# Patient Record
Sex: Female | Born: 1985 | Race: Black or African American | Hispanic: No | Marital: Single | Smoking: Former smoker
Health system: Southern US, Community
[De-identification: ages and names within clinical notes are randomized; demographics above are authoritative.]

## PROBLEM LIST (undated history)

## (undated) HISTORY — PX: NO PAST SURGERIES: SHX2092

---

## 2016-03-22 DIAGNOSIS — I10 Essential (primary) hypertension: Secondary | ICD-10-CM | POA: Insufficient documentation

## 2016-03-22 HISTORY — DX: Essential (primary) hypertension: I10

## 2017-03-22 ENCOUNTER — Ambulatory Visit: Payer: Self-pay | Admitting: Urology

## 2017-03-22 VITALS — BP 143/95 | HR 121 | Temp 98.2°F | Ht 65.0 in | Wt 226.9 lb

## 2017-03-22 DIAGNOSIS — Z0000 Encounter for general adult medical examination without abnormal findings: Secondary | ICD-10-CM

## 2017-03-22 NOTE — Progress Notes (Signed)
  Patient: Jill [name] Female    DOB: 12/08/1985   31 y.o.   MRN: [id number] Visit Date: 03/22/2017  Today's Provider: [facility]   Chief Complaint  Patient presents with  . New Patient (Initial Visit)    Establishing Primary Care   Subjective:    HPI 31 yo AAF who presents today to establish a PCP.  She has no known medical problems.  She has a history of high BP during pregnancy and it is high tonight.    She has not had HA, CP, SOB or weakness.  No visual changes.      No Known Allergies Previous Medications   MEDROXYPROGESTERONE (DEPO-PROVERA) 150 MG/ML INJECTION    Inject 150 mg into the muscle every 3 (three) months.    Review of Systems  Constitutional: Negative.   HENT: Negative.   Eyes: Negative.   Respiratory: Negative.   Cardiovascular: Negative.   Gastrointestinal: Negative.   Endocrine: Negative.   Genitourinary: Negative.   Musculoskeletal: Negative.   Skin: Negative.   Allergic/Immunologic: Negative.   Neurological: Negative.   Hematological: Negative.   Psychiatric/Behavioral: Negative.     Social History  Substance Use Topics  . Smoking status: Current Some Day Smoker    Start date: 10/23/2010  . Smokeless tobacco: Not on file     Comment: 1 every 2 days  . Alcohol use No   Objective:   BP (!) 143/95 (BP Location: Left Arm, Patient Position: Sitting)   Pulse (!) 121   Temp 98.2 F (36.8 C)   Ht 5\' 5"  (1.651 m)   Wt 226 lb 14.4 oz (102.9 kg)   LMP 03/23/2014 (Approximate)   BMI 37.76 kg/m   Physical Exam Constitutional: Well nourished. Alert and oriented, No acute distress. HEENT: [place] AT, moist mucus membranes. Trachea midline, no masses. Cardiovascular: No clubbing, cyanosis, or edema. Respiratory: Normal respiratory effort, no increased work of breathing. GI: Abdomen is soft, non tender, non distended, no abdominal masses. Liver and spleen not palpable.  No hernias appreciated.  Stool sample for occult testing is not  indicated.   GU: No CVA tenderness.  No bladder fullness or masses.   Skin: No rashes, bruises or suspicious lesions. Lymph: No cervical or inguinal adenopathy. Neurologic: Grossly intact, no focal deficits, moving all 4 extremities. Psychiatric: Normal mood and affect.      Assessment & Plan:    1. Health visit   - will check baseline labs  2. HTN  - will most likely need to start BP meds  - reviewed DASH diet and encouraged exericise (moderate to vigorous exercise for 40 minutes x 4 week)         [facility]   [facility]

## 2017-03-22 NOTE — Patient Instructions (Addendum)
DASH Eating Plan DASH stands for "Dietary Approaches to Stop Hypertension." The DASH eating plan is a healthy eating plan that has been shown to reduce high blood pressure (hypertension). It may also reduce your risk for type 2 diabetes, heart disease, and stroke. The DASH eating plan may also help with weight loss. What are tips for following this plan? General guidelines  Avoid eating more than 2,300 mg (milligrams) of salt (sodium) a day. If you have hypertension, you may need to reduce your sodium intake to 1,500 mg a day.  Limit alcohol intake to no more than 1 drink a day for nonpregnant women and 2 drinks a day for men. One drink equals 12 oz of beer, 5 oz of wine, or 1 oz of hard liquor.  Work with your health care provider to maintain a healthy body weight or to lose weight. Ask what an ideal weight is for you.  Get at least 30 minutes of exercise that causes your heart to beat faster (aerobic exercise) most days of the week. Activities may include walking, swimming, or biking.  Work with your health care provider or diet and nutrition specialist (dietitian) to adjust your eating plan to your individual calorie needs. Reading food labels  Check food labels for the amount of sodium per serving. Choose foods with less than 5 percent of the Daily Value of sodium. Generally, foods with less than 300 mg of sodium per serving fit into this eating plan.  To find whole grains, look for the word "whole" as the first word in the ingredient list. Shopping  Buy products labeled as "low-sodium" or "no salt added."  Buy fresh foods. Avoid canned foods and premade or frozen meals. Cooking  Avoid adding salt when cooking. Use salt-free seasonings or herbs instead of table salt or sea salt. Check with your health care provider or pharmacist before using salt substitutes.  Do not fry foods. Cook foods using healthy methods such as baking, boiling, grilling, and broiling instead.  Cook with  heart-healthy oils, such as olive, canola, soybean, or sunflower oil. Meal planning   Eat a balanced diet that includes: ? 5 or more servings of fruits and vegetables each day. At each meal, try to fill half of your plate with fruits and vegetables. ? Up to 6-8 servings of whole grains each day. ? Less than 6 oz of lean meat, poultry, or fish each day. A 3-oz serving of meat is about the same size as a deck of cards. One egg equals 1 oz. ? 2 servings of low-fat dairy each day. ? A serving of nuts, seeds, or beans 5 times each week. ? Heart-healthy fats. Healthy fats called Omega-3 fatty acids are found in foods such as flaxseeds and coldwater fish, like sardines, salmon, and mackerel.  Limit how much you eat of the following: ? Canned or prepackaged foods. ? Food that is high in trans fat, such as fried foods. ? Food that is high in saturated fat, such as fatty meat. ? Sweets, desserts, sugary drinks, and other foods with added sugar. ? Full-fat dairy products.  Do not salt foods before eating.  Try to eat at least 2 vegetarian meals each week.  Eat more home-cooked food and less restaurant, buffet, and fast food.  When eating at a restaurant, ask that your food be prepared with less salt or no salt, if possible. What foods are recommended? The items listed may not be a complete list. Talk with your dietitian about what   dietary choices are best for you. Grains Whole-grain or whole-wheat bread. Whole-grain or whole-wheat pasta. Brown rice. Oatmeal. Quinoa. Bulgur. Whole-grain and low-sodium cereals. Pita bread. Low-fat, low-sodium crackers. Whole-wheat flour tortillas. Vegetables Fresh or frozen vegetables (raw, steamed, roasted, or grilled). Low-sodium or reduced-sodium tomato and vegetable juice. Low-sodium or reduced-sodium tomato sauce and tomato paste. Low-sodium or reduced-sodium canned vegetables. Fruits All fresh, dried, or frozen fruit. Canned fruit in natural juice (without  added sugar). Meat and other protein foods Skinless chicken or turkey. Ground chicken or turkey. Pork with fat trimmed off. Fish and seafood. Egg whites. Dried beans, peas, or lentils. Unsalted nuts, nut butters, and seeds. Unsalted canned beans. Lean cuts of beef with fat trimmed off. Low-sodium, lean deli meat. Dairy Low-fat (1%) or fat-free (skim) milk. Fat-free, low-fat, or reduced-fat cheeses. Nonfat, low-sodium ricotta or cottage cheese. Low-fat or nonfat yogurt. Low-fat, low-sodium cheese. Fats and oils Soft margarine without trans fats. Vegetable oil. Low-fat, reduced-fat, or light mayonnaise and salad dressings (reduced-sodium). Canola, safflower, olive, soybean, and sunflower oils. Avocado. Seasoning and other foods Herbs. Spices. Seasoning mixes without salt. Unsalted popcorn and pretzels. Fat-free sweets. What foods are not recommended? The items listed may not be a complete list. Talk with your dietitian about what dietary choices are best for you. Grains Baked goods made with fat, such as croissants, muffins, or some breads. Dry pasta or rice meal packs. Vegetables Creamed or fried vegetables. Vegetables in a cheese sauce. Regular canned vegetables (not low-sodium or reduced-sodium). Regular canned tomato sauce and paste (not low-sodium or reduced-sodium). Regular tomato and vegetable juice (not low-sodium or reduced-sodium). Pickles. Olives. Fruits Canned fruit in a light or heavy syrup. Fried fruit. Fruit in cream or butter sauce. Meat and other protein foods Fatty cuts of meat. Ribs. Fried meat. Bacon. Sausage. Bologna and other processed lunch meats. Salami. Fatback. Hotdogs. Bratwurst. Salted nuts and seeds. Canned beans with added salt. Canned or smoked fish. Whole eggs or egg yolks. Chicken or turkey with skin. Dairy Whole or 2% milk, cream, and half-and-half. Whole or full-fat cream cheese. Whole-fat or sweetened yogurt. Full-fat cheese. Nondairy creamers. Whipped toppings.  Processed cheese and cheese spreads. Fats and oils Butter. Stick margarine. Lard. Shortening. Ghee. Bacon fat. Tropical oils, such as coconut, palm kernel, or palm oil. Seasoning and other foods Salted popcorn and pretzels. Onion salt, garlic salt, seasoned salt, table salt, and sea salt. Worcestershire sauce. Tartar sauce. Barbecue sauce. Teriyaki sauce. Soy sauce, including reduced-sodium. Steak sauce. Canned and packaged gravies. Fish sauce. Oyster sauce. Cocktail sauce. Horseradish that you find on the shelf. Ketchup. Mustard. Meat flavorings and tenderizers. Bouillon cubes. Hot sauce and Tabasco sauce. Premade or packaged marinades. Premade or packaged taco seasonings. Relishes. Regular salad dressings. Where to find more information:  National Heart, Lung, and Blood Institute: www.nhlbi.nih.gov  American Heart Association: www.heart.org Summary  The DASH eating plan is a healthy eating plan that has been shown to reduce high blood pressure (hypertension). It may also reduce your risk for type 2 diabetes, heart disease, and stroke.  With the DASH eating plan, you should limit salt (sodium) intake to 2,300 mg a day. If you have hypertension, you may need to reduce your sodium intake to 1,500 mg a day.  When on the DASH eating plan, aim to eat more fresh fruits and vegetables, whole grains, lean proteins, low-fat dairy, and heart-healthy fats.  Work with your health care provider or diet and nutrition specialist (dietitian) to adjust your eating plan to your individual   calorie needs. This information is not intended to replace advice given to you by your health care provider. Make sure you discuss any questions you have with your health care provider. Document Released: 09/28/2011 Document Revised: 10/02/2016 Document Reviewed: 10/02/2016 Elsevier Interactive Patient Education  2017 Elsevier Inc.  

## 2017-03-23 LAB — LIPID PANEL
CHOLESTEROL TOTAL: 156 mg/dL (ref 100–199)
Chol/HDL Ratio: 2.8 ratio (ref 0.0–4.4)
HDL: 55 mg/dL (ref >39)
LDL CALC: 88 mg/dL (ref 0–99)
Triglycerides: 64 mg/dL (ref 0–149)
VLDL Cholesterol Cal: 13 mg/dL (ref 5–40)

## 2017-03-23 LAB — COMPREHENSIVE METABOLIC PANEL
A/G RATIO: 2 (ref 1.2–2.2)
ALT: 7 IU/L (ref 0–32)
AST: 13 IU/L (ref 0–40)
Albumin: 4.2 g/dL (ref 3.5–5.5)
Alkaline Phosphatase: 103 IU/L (ref 39–117)
BILIRUBIN TOTAL: 0.2 mg/dL (ref 0.0–1.2)
BUN/Creatinine Ratio: 18 (ref 9–23)
BUN: 12 mg/dL (ref 6–20)
CHLORIDE: 104 mmol/L (ref 96–106)
CO2: 22 mmol/L (ref 18–29)
Calcium: 9.2 mg/dL (ref 8.7–10.2)
Creatinine, Ser: 0.67 mg/dL (ref 0.57–1.00)
GFR calc non Af Amer: 118 mL/min/{1.73_m2} (ref >59)
GFR, EST AFRICAN AMERICAN: 135 mL/min/{1.73_m2} (ref >59)
Globulin, Total: 2.1 g/dL (ref 1.5–4.5)
Glucose: 88 mg/dL (ref 65–99)
POTASSIUM: 4.4 mmol/L (ref 3.5–5.2)
SODIUM: 141 mmol/L (ref 134–144)
Total Protein: 6.3 g/dL (ref 6.0–8.5)

## 2017-03-23 LAB — TSH: TSH: 0.287 u[IU]/mL — ABNORMAL LOW (ref 0.450–4.500)

## 2017-03-23 LAB — CBC WITH DIFFERENTIAL/PLATELET

## 2017-03-23 LAB — HCG, SERUM, QUALITATIVE: hCG,Beta Subunit,Qual,Serum: NEGATIVE m[IU]/mL (ref <6)

## 2017-03-23 LAB — HEMOGLOBIN A1C

## 2017-03-27 NOTE — Addendum Note (Signed)
Addended by: Debby BudHALE, Jasline Buskirk S on: 03/27/2017 06:30 PM   Modules accepted: Orders

## 2017-03-28 LAB — HEMOGLOBIN A1C
Est. average glucose Bld gHb Est-mCnc: 105 mg/dL
Hgb A1c MFr Bld: 5.3 % (ref 4.8–5.6)

## 2017-04-11 ENCOUNTER — Ambulatory Visit: Payer: Self-pay | Admitting: Ophthalmology

## 2018-03-01 LAB — HM HIV SCREENING LAB: HM HIV Screening: NEGATIVE

## 2018-03-29 LAB — HM PAP SMEAR: HM Pap smear: NEGATIVE

## 2019-07-28 ENCOUNTER — Ambulatory Visit (LOCAL_COMMUNITY_HEALTH_CENTER): Payer: Medicaid Other | Admitting: Physician Assistant

## 2019-07-28 ENCOUNTER — Other Ambulatory Visit: Payer: Self-pay

## 2019-07-28 ENCOUNTER — Encounter: Payer: Self-pay | Admitting: Physician Assistant

## 2019-07-28 VITALS — BP 140/106 | Ht 65.0 in | Wt 246.0 lb

## 2019-07-28 DIAGNOSIS — Z113 Encounter for screening for infections with a predominantly sexual mode of transmission: Secondary | ICD-10-CM

## 2019-07-28 DIAGNOSIS — Z3009 Encounter for other general counseling and advice on contraception: Secondary | ICD-10-CM | POA: Diagnosis not present

## 2019-07-28 DIAGNOSIS — Z1388 Encounter for screening for disorder due to exposure to contaminants: Secondary | ICD-10-CM | POA: Diagnosis not present

## 2019-07-28 DIAGNOSIS — B9689 Other specified bacterial agents as the cause of diseases classified elsewhere: Secondary | ICD-10-CM

## 2019-07-28 DIAGNOSIS — N760 Acute vaginitis: Secondary | ICD-10-CM | POA: Diagnosis not present

## 2019-07-28 DIAGNOSIS — Z0389 Encounter for observation for other suspected diseases and conditions ruled out: Secondary | ICD-10-CM | POA: Diagnosis not present

## 2019-07-28 LAB — WET PREP FOR TRICH, YEAST, CLUE
Trichomonas Exam: NEGATIVE
Yeast Exam: NEGATIVE

## 2019-07-28 MED ORDER — METRONIDAZOLE 500 MG PO TABS: 500.0000 mg | ORAL_TABLET | Freq: Two times a day (BID) | ORAL | 0 refills | Status: AC

## 2019-07-28 NOTE — Progress Notes (Signed)
[facility] problem visit  [facility]  Subjective:  Jill [name] is a 33 y.o. being seen today for irregular bleeding with the Nexplanon.  Chief Complaint  Patient presents with  . Contraception    concerns with bleeding with Nexplanon    HPI  Patient reports that she has had irregular bleeding for about 1.5 months off and on.  Also, states that she has noticed a vaginal odor.  Denies other symptoms or concerns today.  Patient states that she has been going through a bad break up since August.  Patient reports that bleeding stopped a few days ago.   Does the patient have a current or past history of drug use? No   No components found for: HCV]   Health Maintenance Due  Topic Date Due  . TETANUS/TDAP  10/01/2004  . INFLUENZA VACCINE  05/24/2019    Review of Systems  All other systems reviewed and are negative.   The following portions of the patient's history were reviewed and updated as appropriate: allergies, current medications, past family history, past medical history, past social history, past surgical history and problem list. Problem list updated.   See flowsheet for other program required questions.  Objective:   Vitals:   07/28/19 1116  BP: (!) 140/106  Weight: 246 lb (111.6 kg)  Height: 5\' 5"  (1.651 m)    Physical Exam Vitals signs reviewed.  Constitutional:      General: She is not in acute distress.    Appearance: Normal appearance.  HENT:     Head: Normocephalic and atraumatic.     Mouth/Throat:     Mouth: Mucous membranes are moist.     Pharynx: Oropharynx is clear. No oropharyngeal exudate or posterior oropharyngeal erythema.  Eyes:     Conjunctiva/sclera: Conjunctivae normal.  Neck:     Musculoskeletal: Neck supple.  Pulmonary:     Effort: Pulmonary effort is normal.  Abdominal:     Palpations: Abdomen is soft. There is no mass.     Tenderness: There is no abdominal tenderness. There is no  guarding or rebound.  Genitourinary:    General: Normal vulva.     Rectum: Normal.     Comments: External genitalia/pubic area without nits, lice, edema, erythema, lesions and inguinal adenopathy. Vagina with normal mucosa and moderate amount of thin, mucoid, white, adherent discharge, pH= >4.5. Cervix without visible lesions. Uterus firm, mobile, nt, no masses, no CMT, no adnexal tenderness or fullness.  Lymphadenopathy:     Cervical: No cervical adenopathy.  Skin:    General: Skin is warm and dry.     Findings: No bruising, erythema, lesion or rash.  Neurological:     Mental Status: She is alert and oriented to person, place, and time.  Psychiatric:        Mood and Affect: Mood normal.        Behavior: Behavior normal.        Thought Content: Thought content normal.        Judgment: Judgment normal.       Assessment and Plan:  Terasa Orsini is a 33 y.o. female presenting to the [facility] Department for a Women's Health problem visit  Patient with current LARC device complaining of irregular bleeding. Has a Nexplanon. Counseled on options which include watchful waiting, scheduled NSAIDs, OCP for limited time, removal. Counseled on the the normality of bleeding with method and typical bleeding patterns with LARC method.   1. Encounter for  counseling regarding contraception Reassured patient that irregular bleeding is common with Nexplanon. Counseled that added stressors can also contribute to irregular bleeding. Please recheck BP and also rec that patient follow up with PCP or urgent care within 24 hrs due to elevated BP today. Rec try OTC IB 800 mg every 8 hr with food or milk for 3-5 days if bleeding resumes and is heavy for 6-7 days or moderate for 10-12 days in a row.  2. Screening for STD (sexually transmitted disease) Patient with new partner and c/o vaginal odor.  Rec condoms with all sex. Await test results.  Counseled that RN will call if needs to RTC  for further treatment once results are back. - WET PREP FOR [place], YEAST, CLUE - Chlamydia/Gonorrhea [facility] - HIV [facility] [facility] - Syphilis Serology, [facility]  3. BV (bacterial vaginosis) Treat for BV with Metronidazole 500 mg #14 1 po BID for 7 days with food, no EtOH for 24 hr before and until 72 hr after completing medicine. No sex for 7 days. Rec use OTC antifungal cream if has itching during or just after antibiotic treatment. - metroNIDAZOLE (FLAGYL) 500 MG tablet; Take 1 tablet (500 mg total) by mouth 2 (two) times daily for 7 days.  Dispense: 14 tablet; Refill: 0     No follow-ups on file.  No future appointments.  Jerene Dilling, PA

## 2019-07-28 NOTE — Progress Notes (Signed)
Pt here today with concerns about vaginal bleeding with Nexplanon. Reports bleeding on and off that started about 1 and a half months ago. Reports 4 or 5 episodes of vaginal bleeding. Reports bleeding ranges from spotting to a regular menstrual flow. Pt BP elevated today, pt denies any headaches, chest pain or other symptoms. Pt desires STD screening today and blood work for HIV and syphillis.Ronny Bacon, RN

## 2019-07-28 NOTE — Progress Notes (Signed)
Wet mount reviewed, patient treated for BV per SO. Repeat BP manual 144/108. Patient counseled on need for follow-up for elevated BP. Patient states she has tried to get an appointment but unable to due to covid and her work scheduled. Patient states she will go to her primary care provider today to ask about appointment for BP. Marland KitchenJenetta Downer, RN

## 2019-08-06 ENCOUNTER — Telehealth: Payer: Self-pay

## 2019-08-06 NOTE — Telephone Encounter (Signed)
TC to patient. Verified ID via password/SS#. Informed of positive GC and need for tx. Instructed to eat before visit and have partner call for tx appt. Appt scheduled. Arliss Frisina, RN   

## 2019-08-07 ENCOUNTER — Ambulatory Visit: Payer: Self-pay

## 2019-08-07 ENCOUNTER — Other Ambulatory Visit: Payer: Self-pay

## 2019-08-07 DIAGNOSIS — A549 Gonococcal infection, unspecified: Secondary | ICD-10-CM

## 2019-08-07 MED ORDER — CEFTRIAXONE SODIUM 250 MG IJ SOLR
250.0000 mg | Freq: Once | INTRAMUSCULAR | Status: AC
  Administered 2019-08-07: 250 mg via INTRAMUSCULAR

## 2019-08-07 MED ORDER — AZITHROMYCIN 500 MG PO TABS
1000.0000 mg | ORAL_TABLET | Freq: Once | ORAL | Status: AC
  Administered 2019-08-07: 17:00:00 1000 mg via ORAL

## 2019-08-07 NOTE — Progress Notes (Signed)
Patient tx'd for GC per SO. Tolerated well Baneza Bartoszek, RN  

## 2019-08-18 ENCOUNTER — Telehealth: Payer: Self-pay | Admitting: Family Medicine

## 2019-08-18 NOTE — Telephone Encounter (Signed)
pls call I am still having symptoms of STD

## 2019-08-18 NOTE — Telephone Encounter (Signed)
RN consulted me after discussing patient concern with patient to review her note.  Reviewed note written by RN and agree with counseling offered.

## 2019-08-18 NOTE — Telephone Encounter (Signed)
Returned call- reports did complete Tx from 08/07/19 visit & c/o occ. Burning with urination-denies fever, urgency, or frequency and reports urine not bloody; reminded if UTI may need primary care; wants recheck "to be sure"; scheduled ~3 wks. Post tx appt: 08/27/19 Debera Lat, RN

## 2019-08-27 ENCOUNTER — Ambulatory Visit: Payer: Self-pay

## 2019-09-09 ENCOUNTER — Ambulatory Visit: Payer: Self-pay | Admitting: Gerontology

## 2020-03-12 ENCOUNTER — Other Ambulatory Visit: Payer: Self-pay

## 2020-03-12 ENCOUNTER — Ambulatory Visit: Payer: Medicaid Other

## 2020-03-12 ENCOUNTER — Encounter: Payer: Self-pay | Admitting: Advanced Practice Midwife

## 2020-03-12 ENCOUNTER — Ambulatory Visit: Payer: Self-pay | Admitting: Advanced Practice Midwife

## 2020-03-12 DIAGNOSIS — A599 Trichomoniasis, unspecified: Secondary | ICD-10-CM

## 2020-03-12 DIAGNOSIS — Z113 Encounter for screening for infections with a predominantly sexual mode of transmission: Secondary | ICD-10-CM

## 2020-03-12 LAB — WET PREP FOR TRICH, YEAST, CLUE
Trichomonas Exam: POSITIVE — AB
Yeast Exam: NEGATIVE

## 2020-03-12 MED ORDER — METRONIDAZOLE 500 MG PO TABS: 2000.0000 mg | ORAL_TABLET | Freq: Once | ORAL | 0 refills | Status: AC

## 2020-03-12 NOTE — Progress Notes (Signed)
[facility] Department STI clinic/screening visit  Subjective:  Jill [name] is a 34 y.o.SBF G7P3 nonsmoker  female being seen today for an STI screening visit because had unprotected sex. The patient reports they do not have symptoms.  Patient reports that they do not desire a pregnancy in the next year.   They reported they are not interested in discussing contraception today.  No LMP recorded. Patient has had an implant.   Patient has the following medical conditions:   Patient Active Problem List   Diagnosis Date Noted  . Morbid obesity ([place]) 246 lbs 03/12/2020  . Hypertension 03/22/2016    Chief Complaint  Patient presents with  . SEXUALLY TRANSMITTED DISEASE    STD screening except bloodwork    HPI  Patient reports LMP 03/03/20.  Last ETOH 03/02/20 (1 cocktail) 1-2x/mo. Last sex 03/10/20  See flowsheet for further details and programmatic requirements.    The following portions of the patient's history were reviewed and updated as appropriate: allergies, current medications, past medical history, past social history, past surgical history and problem list.  Objective:  There were no vitals filed for this visit.  Physical Exam Vitals and nursing note reviewed.  Constitutional:      Appearance: Normal appearance.  HENT:     Head: Normocephalic and atraumatic.     Mouth/Throat:     Mouth: Mucous membranes are moist.     Pharynx: Oropharynx is clear. No oropharyngeal exudate or posterior oropharyngeal erythema.  Eyes:     Conjunctiva/sclera: Conjunctivae normal.  Pulmonary:     Effort: Pulmonary effort is normal.  Abdominal:     Palpations: Abdomen is soft. There is no mass.     Tenderness: There is no abdominal tenderness. There is no rebound.     Comments: Poor tone, increased adipose, soft without tenderness  Genitourinary:    General: Normal vulva.     Exam position: Lithotomy position.     Pubic Area: No rash or pubic lice.      Labia:       Right: No rash or lesion.        Left: No rash or lesion.      Vagina: Vaginal discharge (scant white creamy leukorrhea, ph<4.5) present. No erythema, bleeding or lesions.     Cervix: Normal.     Uterus: Normal.      Adnexa: Right adnexa normal and left adnexa normal.     Rectum: Normal.  Lymphadenopathy:     Head:     Right side of head: No preauricular or posterior auricular adenopathy.     Left side of head: No preauricular or posterior auricular adenopathy.     Cervical: No cervical adenopathy.     Upper Body:     Right upper body: No supraclavicular or axillary adenopathy.     Left upper body: No supraclavicular or axillary adenopathy.     Lower Body: No right inguinal adenopathy. No left inguinal adenopathy.  Skin:    General: Skin is warm and dry.     Findings: No rash.  Neurological:     Mental Status: She is alert and oriented to person, place, and time.      Assessment and Plan:  Jill [name] is a 34 y.o. female presenting to the [facility] Department for STI screening  1. Morbid obesity ([place]) 246 lbs   2. Screening examination for venereal disease Treat wet mount per standing orders Immunization nurse consult - WET PREP FOR [place], YEAST, CLUE -  Chlamydia/Gonorrhea [facility]     Return in about 3 months (around 06/12/2020) for TOC.  No future appointments.  Alberteen Spindle, CNM

## 2020-03-12 NOTE — Progress Notes (Signed)
Wet mount reviewed, pt treated for trich only per standing order. Provider orders completed. 

## 2020-03-23 ENCOUNTER — Telehealth: Payer: Self-pay

## 2020-03-23 DIAGNOSIS — A749 Chlamydial infection, unspecified: Secondary | ICD-10-CM

## 2020-03-23 NOTE — Telephone Encounter (Signed)
TC to patient. Verified ID via password/SS#. Informed of positive chlamydia and need for tx. Instructed to eat before visit and have partner call for tx appt. Appt scheduled.Charnelle Bergeman, RN    

## 2020-03-24 ENCOUNTER — Other Ambulatory Visit: Payer: Self-pay

## 2020-03-24 ENCOUNTER — Ambulatory Visit: Payer: Self-pay

## 2020-03-24 DIAGNOSIS — A749 Chlamydial infection, unspecified: Secondary | ICD-10-CM

## 2020-03-24 MED ORDER — AZITHROMYCIN 500 MG PO TABS
1000.0000 mg | ORAL_TABLET | Freq: Once | ORAL | Status: AC
  Administered 2020-03-24: 1000 mg via ORAL

## 2020-04-11 DIAGNOSIS — Z8619 Personal history of other infectious and parasitic diseases: Secondary | ICD-10-CM

## 2020-04-11 HISTORY — DX: Personal history of other infectious and parasitic diseases: Z86.19

## 2020-11-12 ENCOUNTER — Ambulatory Visit: Payer: Medicaid Other

## 2020-11-15 ENCOUNTER — Encounter: Payer: Self-pay | Admitting: Advanced Practice Midwife

## 2020-11-15 ENCOUNTER — Other Ambulatory Visit: Payer: Self-pay

## 2020-11-15 ENCOUNTER — Ambulatory Visit: Payer: Medicaid Other | Admitting: Advanced Practice Midwife

## 2020-11-15 DIAGNOSIS — Z113 Encounter for screening for infections with a predominantly sexual mode of transmission: Secondary | ICD-10-CM

## 2020-11-15 DIAGNOSIS — Z3009 Encounter for other general counseling and advice on contraception: Secondary | ICD-10-CM | POA: Diagnosis not present

## 2020-11-15 DIAGNOSIS — Z0389 Encounter for observation for other suspected diseases and conditions ruled out: Secondary | ICD-10-CM | POA: Diagnosis not present

## 2020-11-15 DIAGNOSIS — Z1388 Encounter for screening for disorder due to exposure to contaminants: Secondary | ICD-10-CM | POA: Diagnosis not present

## 2020-11-15 LAB — WET PREP FOR TRICH, YEAST, CLUE
Trichomonas Exam: NEGATIVE
Yeast Exam: NEGATIVE

## 2020-11-15 NOTE — Progress Notes (Signed)
[facility] Department STI clinic/screening visit  Subjective:  Jill [name] is a 35 y.o. SBF G4P3 nonsmoker female being seen today for an STI screening visit. The patient reports they do have symptoms.  Patient reports that they do not desire a pregnancy in the next year.   They reported they are not interested in discussing contraception today.  Patient's last menstrual period was 10/26/2020.   Patient has the following medical conditions:   Patient Active Problem List   Diagnosis Date Noted  . Morbid obesity ([facility]) 246 lbs 03/12/2020  . Hypertension 03/22/2016    No chief complaint on file.   HPI  Patient reports "burning" when urinates with malodor x 2 wks.  LMP 10/26/20.  Last sex 11/05/20 without condom; with current partner x 3 mo; 1 partner past 3 mo.  Has Nexplanon.  Last HIV test per patient/review of record was 07/28/19 Patient reports last pap was 03/29/18 neg HPV neg  See flowsheet for further details and programmatic requirements.    The following portions of the patient's history were reviewed and updated as appropriate: allergies, current medications, past medical history, past social history, past surgical history and problem list.  Objective:  There were no vitals filed for this visit.  Physical Exam Vitals and nursing note reviewed.  Constitutional:      Appearance: Normal appearance. She is obese.  HENT:     Head: Normocephalic and atraumatic.     Mouth/Throat:     Mouth: Mucous membranes are moist.     Pharynx: Oropharynx is clear. No oropharyngeal exudate or posterior oropharyngeal erythema.  Pulmonary:     Effort: Pulmonary effort is normal.  Chest:  Breasts:     Right: No axillary adenopathy or supraclavicular adenopathy.     Left: No axillary adenopathy or supraclavicular adenopathy.    Abdominal:     Palpations: Abdomen is soft. There is no mass.     Tenderness: There is no abdominal tenderness. There is no rebound.      Comments: Poor tone, soft without masses or tenderness, increased adipose  Genitourinary:    General: Normal vulva.     Exam position: Lithotomy position.     Pubic Area: No rash or pubic lice.      Labia:        Right: No rash or lesion.        Left: No rash or lesion.      Vagina: Normal. No vaginal discharge (grey creamy leukorrhea, ph<4.5), erythema, bleeding or lesions.     Cervix: Normal.     Uterus: Normal.      Adnexa: Right adnexa normal and left adnexa normal.     Rectum: Normal.  Lymphadenopathy:     Head:     Right side of head: No preauricular or posterior auricular adenopathy.     Left side of head: No preauricular or posterior auricular adenopathy.     Cervical: No cervical adenopathy.     Upper Body:     Right upper body: No supraclavicular or axillary adenopathy.     Left upper body: No supraclavicular or axillary adenopathy.     Lower Body: No right inguinal adenopathy. No left inguinal adenopathy.  Skin:    General: Skin is warm and dry.     Findings: No rash.  Neurological:     Mental Status: She is alert and oriented to person, place, and time.      Assessment and Plan:  Jill [name] is a 35 y.o.  female presenting to the [facility] Department for STI screening  1. Screening examination for venereal disease Treat wet mount per standing orders Immunization nurse consult Please give list of primary care MD to pt - WET PREP FOR [place], YEAST, CLUE - Chlamydia/Gonorrhea [facility] [facility]     No follow-ups on file.  No future appointments.  Alberteen Spindle, CNM

## 2020-11-15 NOTE — Progress Notes (Signed)
Wet mount reviewed, no tx per standing order. Pt accepted PCP list and plans to follow-up with a provider about urinary symptoms. Provider orders completed.

## 2020-11-24 ENCOUNTER — Telehealth: Payer: Self-pay

## 2020-11-24 NOTE — Telephone Encounter (Signed)
Call to patient to inform er of positive gonorrhea result from visit 11/15/2020. Patient scheduled for tx on 11/26/2020. Patient instructed to eat before coming and no sex. All questions answered.   Harvie Heck, RN

## 2020-11-26 ENCOUNTER — Other Ambulatory Visit: Payer: Self-pay

## 2020-11-26 ENCOUNTER — Ambulatory Visit: Payer: Self-pay

## 2020-11-26 DIAGNOSIS — Z113 Encounter for screening for infections with a predominantly sexual mode of transmission: Secondary | ICD-10-CM

## 2020-11-26 DIAGNOSIS — A549 Gonococcal infection, unspecified: Secondary | ICD-10-CM

## 2020-11-26 MED ORDER — CEFTRIAXONE SODIUM 500 MG IJ SOLR
500.0000 mg | Freq: Once | INTRAMUSCULAR | Status: AC
Start: 2020-11-26 — End: 2020-11-26
  Administered 2020-11-26: 500 mg via INTRAMUSCULAR

## 2020-11-26 NOTE — Progress Notes (Signed)
Treated for gonorrhea with Ceftriaxone per [facility] Dr. Kirtland Bouchard. [place]. Tolerated well. Observed for 20 min without prob. Jerel Shepherd, RN

## 2021-01-22 DIAGNOSIS — Z8619 Personal history of other infectious and parasitic diseases: Secondary | ICD-10-CM | POA: Insufficient documentation

## 2021-01-22 HISTORY — DX: Personal history of other infectious and parasitic diseases: Z86.19

## 2021-03-21 ENCOUNTER — Emergency Department: Payer: Medicaid Other

## 2021-03-21 ENCOUNTER — Observation Stay
Admission: EM | Admit: 2021-03-21 | Discharge: 2021-03-22 | Disposition: A | Discharge status: Home / Self Care | Payer: Medicaid Other | Attending: Obstetrics and Gynecology | Admitting: Obstetrics and Gynecology

## 2021-03-21 ENCOUNTER — Encounter
Admission: EM | Disposition: A | Discharge status: Home / Self Care | Payer: Self-pay | Source: Home / Self Care | Attending: Emergency Medicine

## 2021-03-21 ENCOUNTER — Emergency Department: Payer: Medicaid Other | Admitting: Registered Nurse

## 2021-03-21 ENCOUNTER — Other Ambulatory Visit: Payer: Self-pay

## 2021-03-21 DIAGNOSIS — O131 Gestational [pregnancy-induced] hypertension without significant proteinuria, first trimester: Secondary | ICD-10-CM | POA: Diagnosis not present

## 2021-03-21 DIAGNOSIS — I10 Essential (primary) hypertension: Secondary | ICD-10-CM | POA: Diagnosis not present

## 2021-03-21 DIAGNOSIS — R58 Hemorrhage, not elsewhere classified: Secondary | ICD-10-CM

## 2021-03-21 DIAGNOSIS — Z87891 Personal history of nicotine dependence: Secondary | ICD-10-CM

## 2021-03-21 DIAGNOSIS — O009 Unspecified ectopic pregnancy without intrauterine pregnancy: Secondary | ICD-10-CM | POA: Diagnosis not present

## 2021-03-21 DIAGNOSIS — O039 Complete or unspecified spontaneous abortion without complication: Secondary | ICD-10-CM | POA: Diagnosis present

## 2021-03-21 DIAGNOSIS — Z975 Presence of (intrauterine) contraceptive device: Secondary | ICD-10-CM

## 2021-03-21 DIAGNOSIS — Z20822 Contact with and (suspected) exposure to covid-19: Secondary | ICD-10-CM | POA: Diagnosis present

## 2021-03-21 DIAGNOSIS — Z6791 Unspecified blood type, Rh negative: Secondary | ICD-10-CM | POA: Diagnosis not present

## 2021-03-21 DIAGNOSIS — O00102 Left tubal pregnancy without intrauterine pregnancy: Principal | ICD-10-CM | POA: Diagnosis present

## 2021-03-21 DIAGNOSIS — O26899 Other specified pregnancy related conditions, unspecified trimester: Secondary | ICD-10-CM

## 2021-03-21 DIAGNOSIS — O209 Hemorrhage in early pregnancy, unspecified: Secondary | ICD-10-CM | POA: Diagnosis not present

## 2021-03-21 DIAGNOSIS — Z3A01 Less than 8 weeks gestation of pregnancy: Secondary | ICD-10-CM | POA: Diagnosis not present

## 2021-03-21 HISTORY — PX: DIAGNOSTIC LAPAROSCOPY WITH REMOVAL OF ECTOPIC PREGNANCY: SHX6449

## 2021-03-21 LAB — COMPREHENSIVE METABOLIC PANEL
ALT: 13 U/L (ref 0–44)
AST: 20 U/L (ref 15–41)
Albumin: 3.7 g/dL (ref 3.5–5.0)
Alkaline Phosphatase: 66 U/L (ref 38–126)
Anion gap: 8 (ref 5–15)
BUN: 11 mg/dL (ref 6–20)
CO2: 22 mmol/L (ref 22–32)
Calcium: 8.7 mg/dL — ABNORMAL LOW (ref 8.9–10.3)
Chloride: 106 mmol/L (ref 98–111)
Creatinine, Ser: 0.46 mg/dL (ref 0.44–1.00)
GFR, Estimated: >60 mL/min (ref >60)
Glucose, Bld: 86 mg/dL (ref 70–99)
Potassium: 3.5 mmol/L (ref 3.5–5.1)
Sodium: 136 mmol/L (ref 135–145)
Total Bilirubin: 0.5 mg/dL (ref 0.3–1.2)
Total Protein: 6.3 g/dL — ABNORMAL LOW (ref 6.5–8.1)

## 2021-03-21 LAB — CBC WITH DIFFERENTIAL/PLATELET
Abs Immature Granulocytes: 0.02 10*3/uL (ref 0.00–0.07)
Basophils Absolute: 0 10*3/uL (ref 0.0–0.1)
Basophils Relative: 0 %
Eosinophils Absolute: 0.1 10*3/uL (ref 0.0–0.5)
Eosinophils Relative: 1 %
HCT: 36.2 % (ref 36.0–46.0)
Hemoglobin: 12.3 g/dL (ref 12.0–15.0)
Immature Granulocytes: 0 %
Lymphocytes Relative: 31 %
Lymphs Abs: 2.5 10*3/uL (ref 0.7–4.0)
MCH: 31.1 pg (ref 26.0–34.0)
MCHC: 34 g/dL (ref 30.0–36.0)
MCV: 91.6 fL (ref 80.0–100.0)
Monocytes Absolute: 0.7 10*3/uL (ref 0.1–1.0)
Monocytes Relative: 9 %
Neutro Abs: 4.7 10*3/uL (ref 1.7–7.7)
Neutrophils Relative %: 59 %
Platelets: 228 10*3/uL (ref 150–400)
RBC: 3.95 MIL/uL (ref 3.87–5.11)
RDW: 12.3 % (ref 11.5–15.5)
WBC: 8 10*3/uL (ref 4.0–10.5)
nRBC: 0 % (ref 0.0–0.2)

## 2021-03-21 LAB — HCG, QUANTITATIVE, PREGNANCY: hCG, Beta Chain, Quant, S: 5673 m[IU]/mL — ABNORMAL HIGH (ref <5)

## 2021-03-21 LAB — CHLAMYDIA/NGC RT PCR (ARMC ONLY)
Chlamydia Tr: NOT DETECTED
N gonorrhoeae: NOT DETECTED

## 2021-03-21 LAB — ABO/RH: ABO/RH(D): B NEG

## 2021-03-21 LAB — RESP PANEL BY RT-PCR (FLU A&B, COVID) ARPGX2
Influenza A by PCR: NEGATIVE
Influenza B by PCR: NEGATIVE
SARS Coronavirus 2 by RT PCR: NEGATIVE

## 2021-03-21 LAB — ANTIBODY SCREEN: Antibody Screen: NEGATIVE

## 2021-03-21 LAB — POC URINE PREG, ED: Preg Test, Ur: POSITIVE — AB

## 2021-03-21 SURGERY — LAPAROSCOPY, WITH ECTOPIC PREGNANCY SURGICAL TREATMENT
Anesthesia: General | Laterality: Left

## 2021-03-21 MED ORDER — ROCURONIUM BROMIDE 100 MG/10ML IV SOLN
INTRAVENOUS | Status: DC | PRN
  Administered 2021-03-21: 10 mg via INTRAVENOUS
  Administered 2021-03-21: 50 mg via INTRAVENOUS

## 2021-03-21 MED ORDER — DEXAMETHASONE SODIUM PHOSPHATE 10 MG/ML IJ SOLN
INTRAMUSCULAR | Status: AC
  Filled 2021-03-21: qty 1

## 2021-03-21 MED ORDER — LIDOCAINE HCL (PF) 2 % IJ SOLN
INTRAMUSCULAR | Status: AC
  Filled 2021-03-21: qty 2

## 2021-03-21 MED ORDER — ONDANSETRON HCL 4 MG/2ML IJ SOLN
INTRAMUSCULAR | Status: AC
  Filled 2021-03-21: qty 2

## 2021-03-21 MED ORDER — SUCCINYLCHOLINE CHLORIDE 200 MG/10ML IV SOSY
PREFILLED_SYRINGE | INTRAVENOUS | Status: AC
  Filled 2021-03-21: qty 10

## 2021-03-21 MED ORDER — MIDAZOLAM HCL 2 MG/2ML IJ SOLN
INTRAMUSCULAR | Status: AC
  Filled 2021-03-21: qty 2

## 2021-03-21 MED ORDER — ACETAMINOPHEN 10 MG/ML IV SOLN
INTRAVENOUS | Status: AC
  Filled 2021-03-21: qty 100

## 2021-03-21 MED ORDER — OXYCODONE HCL 5 MG PO TABS: 5.0000 mg | ORAL_TABLET | Freq: Once | ORAL | Status: DC | PRN

## 2021-03-21 MED ORDER — PROPOFOL 500 MG/50ML IV EMUL
INTRAVENOUS | Status: DC | PRN
  Administered 2021-03-21: 20 ug/kg/min via INTRAVENOUS

## 2021-03-21 MED ORDER — FENTANYL CITRATE (PF) 100 MCG/2ML IJ SOLN
25.0000 ug | INTRAMUSCULAR | Status: DC | PRN
  Administered 2021-03-21: 50 ug via INTRAVENOUS

## 2021-03-21 MED ORDER — FENTANYL CITRATE (PF) 100 MCG/2ML IJ SOLN
INTRAMUSCULAR | Status: AC
  Filled 2021-03-21: qty 2

## 2021-03-21 MED ORDER — LIDOCAINE HCL (CARDIAC) PF 100 MG/5ML IV SOSY
PREFILLED_SYRINGE | INTRAVENOUS | Status: DC | PRN
  Administered 2021-03-21: 100 mg via INTRAVENOUS

## 2021-03-21 MED ORDER — DEXMEDETOMIDINE (PRECEDEX) IN NS 20 MCG/5ML (4 MCG/ML) IV SYRINGE
PREFILLED_SYRINGE | INTRAVENOUS | Status: AC
  Filled 2021-03-21: qty 5

## 2021-03-21 MED ORDER — OXYCODONE HCL 5 MG/5ML PO SOLN: 5.0000 mg | Freq: Once | ORAL | Status: DC | PRN

## 2021-03-21 MED ORDER — PROPOFOL 10 MG/ML IV BOLUS
INTRAVENOUS | Status: AC
  Filled 2021-03-21: qty 20

## 2021-03-21 MED ORDER — ACETAMINOPHEN 10 MG/ML IV SOLN: 1000.0000 mg | Freq: Once | INTRAVENOUS | Status: DC | PRN

## 2021-03-21 MED ORDER — KETOROLAC TROMETHAMINE 30 MG/ML IJ SOLN
INTRAMUSCULAR | Status: DC | PRN
  Administered 2021-03-21: 30 mg via INTRAVENOUS

## 2021-03-21 MED ORDER — ROCURONIUM BROMIDE 10 MG/ML (PF) SYRINGE
PREFILLED_SYRINGE | INTRAVENOUS | Status: AC
  Filled 2021-03-21: qty 10

## 2021-03-21 MED ORDER — PROPOFOL 10 MG/ML IV BOLUS
INTRAVENOUS | Status: DC | PRN
  Administered 2021-03-21: 200 mg via INTRAVENOUS

## 2021-03-21 MED ORDER — MIDAZOLAM HCL 2 MG/2ML IJ SOLN
INTRAMUSCULAR | Status: DC | PRN
  Administered 2021-03-21: 2 mg via INTRAVENOUS

## 2021-03-21 MED ORDER — FENTANYL CITRATE (PF) 100 MCG/2ML IJ SOLN
INTRAMUSCULAR | Status: AC
  Administered 2021-03-21: 50 ug via INTRAVENOUS
  Filled 2021-03-21: qty 2

## 2021-03-21 MED ORDER — LACTATED RINGERS IV SOLN: INTRAVENOUS | Status: DC

## 2021-03-21 MED ORDER — ONDANSETRON HCL 4 MG/2ML IJ SOLN
INTRAMUSCULAR | Status: AC
  Administered 2021-03-21: 4 mg via INTRAVENOUS
  Filled 2021-03-21: qty 2

## 2021-03-21 MED ORDER — ACETAMINOPHEN 10 MG/ML IV SOLN
INTRAVENOUS | Status: DC | PRN
  Administered 2021-03-21: 1000 mg via INTRAVENOUS

## 2021-03-21 MED ORDER — ACETAMINOPHEN 500 MG PO TABS: 1000.0000 mg | ORAL_TABLET | ORAL | Status: DC

## 2021-03-21 MED ORDER — POVIDONE-IODINE 10 % EX SWAB: 2.0000 "application " | Freq: Once | CUTANEOUS | Status: DC

## 2021-03-21 MED ORDER — DEXMEDETOMIDINE HCL 200 MCG/2ML IV SOLN
INTRAVENOUS | Status: DC | PRN
  Administered 2021-03-21: 12 ug via INTRAVENOUS
  Administered 2021-03-21: 8 ug via INTRAVENOUS

## 2021-03-21 MED ORDER — KETOROLAC TROMETHAMINE 30 MG/ML IJ SOLN
INTRAMUSCULAR | Status: AC
  Filled 2021-03-21: qty 1

## 2021-03-21 MED ORDER — ONDANSETRON HCL 4 MG/2ML IJ SOLN: 4.0000 mg | Freq: Once | INTRAMUSCULAR | Status: AC | PRN

## 2021-03-21 MED ORDER — DEXAMETHASONE SODIUM PHOSPHATE 10 MG/ML IJ SOLN
INTRAMUSCULAR | Status: DC | PRN
  Administered 2021-03-21: 10 mg via INTRAVENOUS

## 2021-03-21 MED ORDER — GABAPENTIN 300 MG PO CAPS: 300.0000 mg | ORAL_CAPSULE | ORAL | Status: DC

## 2021-03-21 MED ORDER — SUGAMMADEX SODIUM 200 MG/2ML IV SOLN
INTRAVENOUS | Status: DC | PRN
  Administered 2021-03-21: 200 mg via INTRAVENOUS

## 2021-03-21 MED ORDER — ONDANSETRON HCL 4 MG/2ML IJ SOLN
INTRAMUSCULAR | Status: DC | PRN
  Administered 2021-03-21: 4 mg via INTRAVENOUS

## 2021-03-21 MED ORDER — RHO D IMMUNE GLOBULIN 1500 UNIT/2ML IJ SOSY
300.0000 ug | PREFILLED_SYRINGE | Freq: Once | INTRAMUSCULAR | Status: AC
  Administered 2021-03-21: 300 ug via INTRAMUSCULAR
  Filled 2021-03-21: qty 2

## 2021-03-21 MED ORDER — FENTANYL CITRATE (PF) 100 MCG/2ML IJ SOLN
INTRAMUSCULAR | Status: DC | PRN
  Administered 2021-03-21: 100 ug via INTRAVENOUS

## 2021-03-21 MED ORDER — BUPIVACAINE HCL 0.5 % IJ SOLN
INTRAMUSCULAR | Status: DC | PRN
  Administered 2021-03-21: 27 mL

## 2021-03-21 SURGICAL SUPPLY — 39 items
BLADE SURG SZ11 CARB STEEL (BLADE) ×2 IMPLANT
CANISTER SUCT 1200ML W/VALVE (MISCELLANEOUS) ×2 IMPLANT
CATH ROBINSON RED A/P 16FR (CATHETERS) ×2 IMPLANT
CHLORAPREP W/TINT 26 (MISCELLANEOUS) ×2 IMPLANT
CORD MONOPOLAR M/FML 12FT (MISCELLANEOUS) IMPLANT
COVER WAND RF STERILE (DRAPES) IMPLANT
DERMABOND ADVANCED (GAUZE/BANDAGES/DRESSINGS) ×1
DERMABOND ADVANCED .7 DNX12 (GAUZE/BANDAGES/DRESSINGS) ×1 IMPLANT
GAUZE 4X4 16PLY RFD (DISPOSABLE) ×2 IMPLANT
GLOVE SURG ENC MOIS LTX SZ6.5 (GLOVE) ×2 IMPLANT
GLOVE SURG ENC MOIS LTX SZ8 (GLOVE) ×2 IMPLANT
GLOVE SURG UNDER LTX SZ7 (GLOVE) ×2 IMPLANT
GOWN STRL REUS W/ TWL LRG LVL3 (GOWN DISPOSABLE) ×2 IMPLANT
GOWN STRL REUS W/TWL LRG LVL3 (GOWN DISPOSABLE) ×2
GOWN STRL REUS W/TWL XL LVL4 (GOWN DISPOSABLE) ×2 IMPLANT
GRASPER SUT TROCAR 14GX15 (MISCELLANEOUS) IMPLANT
IRRIGATION STRYKERFLOW (MISCELLANEOUS) ×1 IMPLANT
IRRIGATOR STRYKERFLOW (MISCELLANEOUS) ×2
IV LACTATED RINGERS 1000ML (IV SOLUTION) ×2 IMPLANT
KIT PINK PAD W/HEAD ARE REST (MISCELLANEOUS) ×2
KIT PINK PAD W/HEAD ARM REST (MISCELLANEOUS) ×1 IMPLANT
KIT TURNOVER CYSTO (KITS) ×2 IMPLANT
MANIFOLD NEPTUNE II (INSTRUMENTS) ×2 IMPLANT
NS IRRIG 500ML POUR BTL (IV SOLUTION) ×2 IMPLANT
PACK GYN LAPAROSCOPIC (MISCELLANEOUS) ×2 IMPLANT
PAD OB MATERNITY 4.3X12.25 (PERSONAL CARE ITEMS) ×2 IMPLANT
PAD PREP 24X41 OB/GYN DISP (PERSONAL CARE ITEMS) ×2 IMPLANT
POUCH ENDO CATCH 10MM SPEC (MISCELLANEOUS) ×2 IMPLANT
POUCH SPECIMEN RETRIEVAL 10MM (ENDOMECHANICALS) ×2 IMPLANT
SCISSORS METZENBAUM CVD 33 (INSTRUMENTS) IMPLANT
SET TUBE SMOKE EVAC HIGH FLOW (TUBING) ×2 IMPLANT
SHEARS HARMONIC ACE PLUS 36CM (ENDOMECHANICALS) ×2 IMPLANT
SLEEVE ENDOPATH XCEL 5M (ENDOMECHANICALS) ×2 IMPLANT
SUT VIC AB 3-0 SH 27 (SUTURE)
SUT VIC AB 3-0 SH 27X BRD (SUTURE) IMPLANT
SUT VICRYL 0 AB UR-6 (SUTURE) ×2 IMPLANT
TROCAR ENDO BLADELESS 11MM (ENDOMECHANICALS) ×2 IMPLANT
TROCAR XCEL NON-BLD 5MMX100MML (ENDOMECHANICALS) ×2 IMPLANT
TROCAR XCEL UNIV SLVE 11M 100M (ENDOMECHANICALS) ×2 IMPLANT

## 2021-03-21 NOTE — ED Provider Notes (Signed)
[facility] Emergency Department Provider Note  ____________________________________________   I have reviewed the triage vital signs and the nursing notes.   HISTORY  Chief Complaint Vaginal Bleeding   History limited by: Not Limited   HPI Latera Mclin is a 35 y.o. female who presents to the emergency department today because of concerns for vaginal bleeding in the setting of early pregnancy.  Patient states that she is roughly 8 weeks by dates.  She states that she started having some spotting today.  She states she has now had more significant bleeding going through a couple of pads in a couple of hours.  The patient has had some associated cramping.  She states that she has had 1 miscarriage in the past.   Records reviewed. Per medical record review patient has a history of HTN  History reviewed. No pertinent past medical history.  Patient Active Problem List   Diagnosis Date Noted  . Morbid obesity (HCC) 246 lbs 03/12/2020  . Hypertension 03/22/2016    History reviewed. No pertinent surgical history.  Prior to Admission medications   Medication Sig Start Date End Date Taking? Authorizing Provider  etonogestrel (NEXPLANON) 68 MG IMPL implant 1 each by Subdermal route once. 03/29/18   Matt Holmes, PA  medroxyPROGESTERone (DEPO-PROVERA) 150 MG/ML injection Inject 150 mg into the muscle every 3 (three) months.    [provider]    Allergies Patient has no known allergies.  Family History  Problem Relation Age of Onset  . Hypertension Mother   . Diabetes Mother   . Hypertension Father   . Diabetes Father     Social History Social History   Tobacco Use  . Smoking status: Former Smoker    Start date: 10/23/2010  . Smokeless tobacco: Never Used  . Tobacco comment: 1 every 2 days  Substance Use Topics  . Alcohol use: Not Currently    Comment: last use 05/2020  . Drug use: No    Review of Systems Constitutional: No  fever/chills Eyes: No visual changes. ENT: No sore throat. Cardiovascular: Denies chest pain. Respiratory: Denies shortness of breath. Gastrointestinal: No abdominal pain.  No nausea, no vomiting.  No diarrhea.   Genitourinary: Positive for vaginal bleeding and suprapubic cramping.  Musculoskeletal: Negative for back pain. Skin: Negative for rash. Neurological: Negative for headaches, focal weakness or numbness.  ____________________________________________   PHYSICAL EXAM:  VITAL SIGNS: ED Triage Vitals  Enc Vitals Group     BP 03/21/21 1525 (!) 129/91     Pulse Rate 03/21/21 1525 (!) 120     Resp 03/21/21 1525 18     Temp 03/21/21 1525 98.6 F (37 C)     Temp src --      SpO2 03/21/21 1525 100 %     Weight --      Height --      Head Circumference --      Peak Flow --      Pain Score 03/21/21 1523 5   Constitutional: Alert and oriented.  Eyes: Conjunctivae are normal.  ENT      Head: Normocephalic and atraumatic.      Nose: No congestion/rhinnorhea.      Mouth/Throat: Mucous membranes are moist.      Neck: No stridor. Hematological/Lymphatic/Immunilogical: No cervical lymphadenopathy. Cardiovascular: Normal rate, regular rhythm.  No murmurs, rubs, or gallops.  Respiratory: Normal respiratory effort without tachypnea nor retractions. Breath sounds are clear and equal bilaterally. No wheezes/rales/rhonchi. Gastrointestinal: Soft and non tender.  No rebound. No guarding.  Genitourinary: Deferred Musculoskeletal: Normal range of motion in all extremities. No lower extremity edema. Neurologic:  Normal speech and language. No gross focal neurologic deficits are appreciated.  Skin:  Skin is warm, dry and intact. No rash noted. Psychiatric: Mood and affect are normal. Speech and behavior are normal. Patient exhibits appropriate insight and judgment.  ____________________________________________    LABS (pertinent positives/negatives)  Upreg positive ABO/RH B neg hcg  5673  ____________________________________________   EKG  None  ____________________________________________    RADIOLOGY  US ob Ectopic pregnancy adjacent to left ovary  ____________________________________________   PROCEDURES  Procedures  ____________________________________________   INITIAL IMPRESSION / ASSESSMENT AND PLAN / ED COURSE  Pertinent labs & imaging results that were available during my care of the patient were reviewed by me and considered in my medical decision making (see chart for details).   Patient presented to the emergency department today because of concerns for vaginal bleeding and some abdominal cramping in the setting of early pregnancy.  Patient states that she thought she was about [redacted] weeks pregnant by last menstrual period.  Patient's ultrasound here unfortunately shows ectopic pregnancy adjacent to her left ovary.  Discussed with Dr. Valentino Saxon with OB/GYN who will evaluate patient.  Additionally patient was found to be be negative so will give RhoGAM here.  Discussed findings and plan with patient.  ____________________________________________   FINAL CLINICAL IMPRESSION(S) / ED DIAGNOSES  Final diagnoses:  Bleeding     Note: This dictation was prepared with Dragon dictation. Any transcriptional errors that result from this process are unintentional     Phineas Semen, MD 03/21/21 [phone]

## 2021-03-21 NOTE — Op Note (Signed)
Procedure(s): OPERATIVE LAPAROSCOPY WITH REMOVAL OF ECTOPIC PREGNANCY (LEFT) Procedure Note  Jill [name] female 35 y.o. 03/21/2021  Indications: The patient is a 35 y.o. [id number] morbidly obese female (BMI>40) with with left ectopic pregnancy.  Nexplanon implant in place.  Also with prior h/o gonorrhea (recent) and chlamydia infection  Pre-operative Diagnosis: Left fallopian tube ectopic pregnancy (intact), Nexplanon implant in place  Post-operative Diagnosis: Same  Surgeon: Hildred Laser, MD  Assistants:  Surgical scrub tech. An experienced assistant was required given the standard of surgical care given the complexity of the case.  This assistant was needed for exposure, dissection, suctioning, retraction, instrument exchange, and for overall help during the procedure.  Anesthesia: General endotracheal anesthesia  Findings: Dilated left fallopian tube containing ectopic gestation. Few adhesions of the ectopic to the left ovary and the omentum. Small normal appearing uterus, normal right fallopian tube, left ovary and right ovary.  Normal appearing upper abdomen.   Procedure Details: The patient was seen in the Holding Room. The risks, benefits, complications, treatment options, and expected outcomes were discussed with the patient.  The patient concurred with the proposed plan, giving informed consent.  The site of surgery properly noted/marked. The patient was taken to the Operating Room, identified as Jill [name] and the procedure verified as Procedure(s) (LRB): DIAGNOSTIC LAPAROSCOPY WITH REMOVAL OF ECTOPIC PREGNANCY (Left). A Time Out was held and the above information confirmed.  She was then placed under general anesthesia without difficulty. She was placed in the dorsal lithotomy position, and was prepped and draped in a sterile manner.  A straight catheterization was performed. A sterile speculum was inserted into the vagina and the cervix was grasped at the anterior lip  using a single-toothed tenaculum.  A Hulka clamp was placed for uterine manipulation.  The speculum and tenaculum were then removed. After an adequate timeout was performed, attention was turned to the abdomen where an umbilical incision was made with the scalpel.  The Optiview 5-mm trocar and sleeve were then advanced without difficulty with the laparoscope under direct visualization into the abdomen.  The abdomen was then insufflated with carbon dioxide gas and adequate pneumoperitoneum was obtained. A 5-mm left lower quadrant port and an 11-mm right lower quadrant port were then placed under direct visualization.  A survey of the patient's pelvis and abdomen revealed the findings as above.   [name]   Attention was then turned to the left fallopian tube which was grasped and ligated from the underlying mesosalpinx and uterine attachment using the Harmonic instrument. A small portion of the ectopic segment had to be teased from the left ovary and the omentum due to adhesions.  Good hemostasis was noted.  The specimen was placed in an EndoCatch bag and removed from the abdomen intact. A PMI device was used to close the fascia of the 11 mm port site using a 0-Vicryl suture.  The abdomen was desufflated, and all instruments were removed. The subcutaneous fat layer was reapproximated with a figure-of-eight stitch using 3-0 Vicryl. All other skin incisions were closed with 4-0 Monocryl and Dermabond. A total of 27 ml of 0.5% Sensorcaine was injected into the incision sites.  The patient tolerated the procedure well.  Sponge, lap, and needle counts were correct times three.  The patient was then taken to the recovery room awake, extubated and in stable condition.   The patient will be discharged to home as per PACU criteria.  Routine postoperative instructions given.  She was prescribed Percocet, Ibuprofen and Colace.  She will follow up in the office in about 2-3 weeks for postoperative  evaluation.  Given the late hour of her procedure, patient will be observed overnight in the hospital.  Will discharge in the morning if she remains stable.    Estimated Blood Loss:  Minimal (> 10 ml)      Drains: straight catheterization prior to procedure with  300 ml of amber-colored urine         Total IV Fluids:  1000 ml  Specimens: Products of conception contained in left ectopic pregnancy         Implants: None         Complications:  None; patient tolerated the procedure well.         Disposition: PACU - hemodynamically stable.         Condition: stable   Hildred Laser, MD [facility]

## 2021-03-21 NOTE — Anesthesia Preprocedure Evaluation (Signed)
Anesthesia Evaluation  Patient identified by MRN, date of birth, ID band Patient awake  General Assessment Comment:First-time anesthetic. Denies N/V with this ectopic pregnancy presentation.  Reviewed: Allergy & Precautions, NPO status , Patient's Chart, lab work & pertinent test results  History of Anesthesia Complications Negative for: history of anesthetic complications  Airway Mallampati: II  TM Distance: >3 FB Neck ROM: Full    Dental no notable dental hx. (+) Teeth Intact   Pulmonary neg pulmonary ROS, neg sleep apnea, neg COPD, Patient abstained from smoking.Not current smoker, former smoker,    Pulmonary exam normal breath sounds clear to auscultation       Cardiovascular Exercise Tolerance: Good METS(-) hypertension(-) CAD and (-) Past MI negative cardio ROS  (-) dysrhythmias  Rhythm:Regular Rate:Normal - Systolic murmurs HTN listed, but patient denies   Neuro/Psych negative neurological ROS  negative psych ROS   GI/Hepatic neg GERD  ,(+)     (-) substance abuse  ,   Endo/Other  neg diabetes  Renal/GU negative Renal ROS     Musculoskeletal   Abdominal   Peds  Hematology   Anesthesia Other Findings Past Medical History: 01/22/2021: History of chlamydia 04/11/2020: History of gonorrhea 03/22/2016: Hypertension  Reproductive/Obstetrics (+) Pregnancy ECTOPIC PREGNANCY                             Anesthesia Physical Anesthesia Plan  ASA: II and emergent  Anesthesia Plan: General   Post-op Pain Management:    Induction: Intravenous  PONV Risk Score and Plan: 4 or greater and Ondansetron, Dexamethasone and Midazolam  Airway Management Planned: Oral ETT  Additional Equipment: None  Intra-op Plan:   Post-operative Plan: Extubation in OR  Informed Consent: I have reviewed the patients History and Physical, chart, labs and discussed the procedure including the risks,  benefits and alternatives for the proposed anesthesia with the patient or authorized representative who has indicated his/her understanding and acceptance.     Dental advisory given  Plan Discussed with: CRNA and Surgeon  Anesthesia Plan Comments: (Discussed risks of anesthesia with patient, including PONV, sore throat, lip/dental damage. Rare risks discussed as well, such as cardiorespiratory and neurological sequelae. Patient understands.)        Anesthesia Quick Evaluation

## 2021-03-21 NOTE — ED Triage Notes (Addendum)
Pt comes with c/o vaginal bleeding that started today. Pt state some cramping. Pt states about [redacted] weeks pregnant.  Pt has not had obgyn appt yet.  Pt states small clots and about 2 pads she has gone through so far.

## 2021-03-21 NOTE — Discharge Instructions (Signed)
Diagnostic Laparoscopy, Care After The following information offers guidance on how to care for yourself after your procedure. Your health care provider may also give you more specific instructions. If you have problems or questions, contact your health care provider. What can I expect after the procedure? After the procedure, it is common to have:  Mild discomfort in the abdomen.  Sore throat. Women who have laparoscopy with a pelvic examination may have mild cramping and fluid coming from the vagina for a few days after the procedure. Follow these instructions at home: Medicines  Take over-the-counter and prescription medicines only as told by your health care provider.  If you were prescribed an antibiotic medicine, take it as told by your health care provider. Do not stop taking the antibiotic even if you start to feel better.  Ask your health care provider if the medicine prescribed to you: ? Requires you to avoid driving or using machinery. ? Can cause constipation. You may need to take these actions to prevent or treat constipation:  Drink enough fluid to keep your urine pale yellow.  Take over-the-counter or prescription medicines.  Eat foods that are high in fiber, such as beans, whole grains, and fresh fruits and vegetables.  Limit foods that are high in fat and processed sugars, such as fried or sweet foods. Incision care  Follow instructions from your health care provider about how to take care of your incisions. Make sure you: ? Wash your hands with soap and water for at least 20 seconds before and after you change your bandage (dressing). If soap and water are not available, use hand sanitizer. ? Change your dressing as told by your health care provider. ? Leave stitches (sutures), skin glue, or surgical tape in place. These skin closures may need to stay in place for 2 weeks or longer. If surgical tape edges start to loosen and curl up, you may trim the loose edges. Do  not remove the surgical tape completely unless your health care provider tells you to do that.  Check your incision areas every day for signs of infection. Check for: ? Redness, swelling, or pain. ? Fluid or blood. ? Warmth. ? Pus or a bad smell.   Activity  Return to your normal activities as told by your health care provider. Ask your health care provider what activities are safe for you.  Do not lift anything that is heavier than 10 lb (4.5 kg), or the limit that you are told, until your health care provider says that it is safe.  Avoid sitting for a long time without moving. Get up to take short walks every 1-2 hours. This is important to improve blood flow and breathing. Ask for help if you feel weak or unsteady. General instructions  Do not use any products that contain nicotine or tobacco. These products include cigarettes, chewing tobacco, and vaping devices, such as e-cigarettes. If you need help quitting, ask your health care provider.  If you were given a sedative during the procedure, it can affect you for several hours. Do not drive or operate machinery until your health care provider says that it is safe.  Do not take baths, swim, or use a hot tub until your health care provider approves. Ask your health care provider if you may take showers. You may only be allowed to take sponge baths.  Keep all follow-up visits. This is important. Contact a health care provider if:  You develop shoulder pain.  You feel light-headed or   faint.  You are unable to pass gas or have a bowel movement.  You feel nauseous or you vomit.  You develop a rash.  You have any of these signs of infection: ? Redness, swelling, or pain around an incision. ? Fluid or blood coming from an incision. ? Warmth coming from an incision. ? Pus or a bad smell coming from an incision. ? A fever or chills. Get help right away if:  You have severe pain.  You have vomiting that does not go away.  You  have heavy bleeding from the vagina.  Any incision opens up.  You have trouble breathing.  You have chest pain. These symptoms may represent a serious problem that is an emergency. Do not wait to see if the symptoms will go away. Get medical help right away. Call your local emergency services (911 in the [place] Do not drive yourself to the hospital. Summary  After the procedure, it is common to have mild discomfort in the abdomen and a sore throat.  Check your incision areas every day for signs of infection.  Return to your normal activities as told by your health care provider. Ask your health care provider what activities are safe for you. This information is not intended to replace advice given to you by your health care provider. Make sure you discuss any questions you have with your health care provider. Document Revised: 06/04/2020 Document Reviewed: 06/04/2020 Elsevier Patient Education  2021 [employer]  

## 2021-03-21 NOTE — Anesthesia Procedure Notes (Signed)
Procedure Name: Intubation Date/Time: 03/21/2021 9:58 PM Performed by: Karoline Caldwell, CRNA Pre-anesthesia Checklist: Patient identified, Patient being monitored, Timeout performed, Emergency Drugs available and Suction available Patient Re-evaluated:Patient Re-evaluated prior to induction Oxygen Delivery Method: Circle system utilized Preoxygenation: Pre-oxygenation with 100% oxygen Induction Type: IV induction Ventilation: Mask ventilation without difficulty Laryngoscope Size: 3 and McGraph Grade View: Grade I Tube type: Oral Tube size: 7.0 mm Number of attempts: 1 Airway Equipment and Method: Stylet and Video-laryngoscopy Placement Confirmation: ETT inserted through vocal cords under direct vision,  positive ETCO2 and breath sounds checked- equal and bilateral Secured at: 21 cm Tube secured with: Tape Dental Injury: Teeth and Oropharynx as per pre-operative assessment

## 2021-03-21 NOTE — H&P (Addendum)
GYNECOLOGY CONSULT NOTE  Reason for Consult: Ectopic pregnancy Referring Physician: Antoine Primas (ER Physician)  Jill [name] is an 35 y.o. obese [phone] female who presented to the Emergency Room with complaints of moderate vaginal bleeding since this morning.  She is currently pregnant, with unsure LMP sometime in April.  Has associated mild cramping. She has had 1 miscarriage in the past, and so was concerned for this pregnancy.  Currently also has a Nexplanon in place (was due for removal in July of this year).     Pertinent Gynecological History: Menses: regular every month without intermenstrual spotting Contraception: Nexplanon Blood transfusions: none Sexually transmitted diseases: past history: Chlamydia (03/2020), and Gonorrhea 11/26/2020. Treated.  Previous GYN Procedures: None  Last pap: normal Date: approximately 2 years ago.    Menstrual History: Menarche age: 27 No LMP recorded. Patient has had an implant.     OB History  Gravida Para Term Preterm AB Living  SAB IAB Ectopic Multiple Live Births  1            # Outcome Date GA Lbr Len/2nd Weight Sex Delivery Anes PTL Lv  6 Current           5 SAB           4 Term      Vag-Spont     3 Term      Vag-Spont     2 Term      Vag-Spont     1 Term      Vag-Spont        Past Medical History:  Diagnosis Date  . History of chlamydia 01/22/2021  . History of gonorrhea 04/11/2020  . Hypertension 03/22/2016    Past Surgical History:  Procedure Laterality Date  . NO PAST SURGERIES      Family History  Problem Relation Age of Onset  . Hypertension Mother   . Diabetes Mother   . Hypertension Father   . Diabetes Father     Social History:  reports that she has quit smoking. She started smoking about 10 years ago. She has never used smokeless tobacco. She reports previous alcohol use. She reports that she does not use drugs.  Allergies: No Known Allergies  Medications:   No current  facility-administered medications on file prior to encounter.   Current Outpatient Medications on File Prior to Encounter  Medication Sig Dispense Refill  . etonogestrel (NEXPLANON) 68 MG IMPL implant 1 each by Subdermal route once.      Review of Systems  Constitutional: Negative.   HENT: Negative.   Eyes: Negative.   Respiratory: Negative.   Cardiovascular: Negative.   Gastrointestinal: Negative.   Endocrine: Negative.   Genitourinary: Positive for vaginal bleeding. Negative for flank pain, pelvic pain and vaginal discharge.  Musculoskeletal: Negative.   Skin: Negative.   Allergic/Immunologic: Negative.   Neurological: Negative.  Dizziness:    Hematological: Negative.   Psychiatric/Behavioral: Negative.     Blood pressure (!) 148/86, pulse 87, temperature 98.2 F (36.8 C), temperature source Oral, resp. rate 17, SpO2 100 %. Physical Exam Constitutional:      General: She is not in acute distress.    Appearance: Normal appearance. She is obese. She is not ill-appearing.  HENT:     Head: Normocephalic and atraumatic.     Nose: Nose normal. No congestion or rhinorrhea.     Mouth/Throat:     Mouth: Mucous membranes are moist.  Neurological:  Mental Status: She is alert.  Physical Exam Eyes:     Extraocular Movements: Extraocular movements intact.     Conjunctiva/sclera: Conjunctivae normal.  Cardiovascular:     Rate and Rhythm: Normal rate and regular rhythm.     Heart sounds: Normal heart sounds.  Pulmonary:     Effort: Pulmonary effort is normal. No respiratory distress.     Breath sounds: Normal breath sounds. No wheezing.  Abdominal:     General: Abdomen is flat. Bowel sounds are normal. There is no distension.     Palpations: Abdomen is soft. There is no mass.     Tenderness: There is no abdominal tenderness. There is no guarding or rebound.  Genitourinary:    Comments: Deferred to OR Musculoskeletal:        General: No swelling or tenderness. Normal range  of motion.     Cervical back: Normal range of motion and neck supple.  Skin:    General: Skin is warm.      Results for orders placed or performed during the hospital encounter of 03/21/21 (from the past 48 hour(s))  hCG, quantitative, pregnancy     Status: Abnormal   Collection Time: 03/21/21  3:25 PM  Result Value Ref Range   hCG, Beta Chain, Quant, S 5,673 (H) <5 mIU/mL    Comment:          GEST. AGE      CONC.  (mIU/mL)   <=1 WEEK        5 - 50     2 WEEKS       50 - 500     3 WEEKS       100 - 10,000     4 WEEKS     1,000 - 30,000     5 WEEKS     3,500 - 115,000   6-8 WEEKS     12,000 - 270,000    12 WEEKS     15,000 - 220,000        FEMALE AND NON-PREGNANT FEMALE:     LESS THAN 5 mIU/mL Performed at [facility], [address] [place], [place] [address]   POC urine preg, ED     Status: Abnormal   Collection Time: 03/21/21  3:28 PM  Result Value Ref Range   Preg Test, Ur POSITIVE (A) NEGATIVE    Comment:        THE SENSITIVITY OF THIS METHODOLOGY IS >24 mIU/mL   ABO/Rh     Status: None   Collection Time: 03/21/21  4:55 PM  Result Value Ref Range   ABO/RH(D)      B NEG Performed at [facility], [address] [place], [place] [address]   Antibody screen     Status: None   Collection Time: 03/21/21  4:55 PM  Result Value Ref Range   Antibody Screen      NEG Performed at [facility], [address] [place], [place] [address]   Rhogam injection     Status: None (Preliminary result)   Collection Time: 03/21/21  4:55 PM  Result Value Ref Range   Unit Number [id number]    Blood Component Type RHIG    Unit division 00    Status of Unit ISSUED    Transfusion Status      OK TO TRANSFUSE Performed at [facility], [address] [place], [place] [address]   Resp Panel by RT-PCR (Flu A&B, Covid) Nasopharyngeal Swab  Status: None   Collection Time: 03/21/21  7:23 PM   Specimen: Nasopharyngeal Swab;  Nasopharyngeal(NP) swabs in vial transport medium  Result Value Ref Range   SARS Coronavirus 2 by RT PCR NEGATIVE NEGATIVE    Comment: (NOTE) SARS-CoV-2 target nucleic acids are NOT DETECTED.  The SARS-CoV-2 RNA is generally detectable in upper respiratory specimens during the acute phase of infection. The lowest concentration of SARS-CoV-2 viral copies this assay can detect is 138 copies/mL. A negative result does not preclude SARS-Cov-2 infection and should not be used as the sole basis for treatment or other patient management decisions. A negative result may occur with  improper specimen collection/handling, submission of specimen other than nasopharyngeal swab, presence of viral mutation(s) within the areas targeted by this assay, and inadequate number of viral copies(<138 copies/mL). A negative result must be combined with clinical observations, patient history, and epidemiological information. The expected result is Negative.  Fact Sheet for Patients:  BloggerCourse.comhttps://www.fda.gov/media/152166/download  Fact Sheet for Healthcare Providers:  SeriousBroker.ithttps://www.fda.gov/media/152162/download  This test is no t yet approved or cleared by the [place] FDA and  has been authorized for detection and/or diagnosis of SARS-CoV-2 by FDA under an Emergency Use Authorization (EUA). This EUA will remain  in effect (meaning this test can be used) for the duration of the COVID-19 declaration under Section 564(b)(1) of the Act, 21 U.S.C.section 360bbb-3(b)(1), unless the authorization is terminated  or revoked sooner.       Influenza A by PCR NEGATIVE NEGATIVE   Influenza B by PCR NEGATIVE NEGATIVE    Comment: (NOTE) The Xpert Xpress SARS-CoV-2/FLU/RSV plus assay is intended as an aid in the diagnosis of influenza from Nasopharyngeal swab specimens and should not be used as a sole basis for treatment. Nasal washings and aspirates are unacceptable for Xpert Xpress  SARS-CoV-2/FLU/RSV testing.  Fact Sheet for Patients: BloggerCourse.comhttps://www.fda.gov/media/152166/download  Fact Sheet for Healthcare Providers: SeriousBroker.ithttps://www.fda.gov/media/152162/download  This test is not yet approved or cleared by the [place] FDA and has been authorized for detection and/or diagnosis of SARS-CoV-2 by FDA under an Emergency Use Authorization (EUA). This EUA will remain in effect (meaning this test can be used) for the duration of the COVID-19 declaration under Section 564(b)(1) of the Act, 21 U.S.C. section 360bbb-3(b)(1), unless the authorization is terminated or revoked.  Performed at [facility], [address] [place], [place] [id number]   Comprehensive metabolic panel     Status: Abnormal   Collection Time: 03/21/21  7:46 PM  Result Value Ref Range   Sodium 136 135 - 145 mmol/L   Potassium 3.5 3.5 - 5.1 mmol/L   Chloride 106 98 - 111 mmol/L   CO2 22 22 - 32 mmol/L   Glucose, Bld 86 70 - 99 mg/dL    Comment: Glucose reference range applies only to samples taken after fasting for at least 8 hours.   BUN 11 6 - 20 mg/dL   Creatinine, Ser 8.460.46 0.44 - 1.00 mg/dL   Calcium 8.7 (L) 8.9 - 10.3 mg/dL   Total Protein 6.3 (L) 6.5 - 8.1 g/dL   Albumin 3.7 3.5 - 5.0 g/dL   AST 20 15 - 41 U/L   ALT 13 0 - 44 U/L   Alkaline Phosphatase 66 38 - 126 U/L   Total Bilirubin 0.5 0.3 - 1.2 mg/dL   GFR, Estimated >96>60 >29>60 mL/min    Comment: (NOTE) Calculated using the CKD-EPI Creatinine Equation (2021)    Anion gap 8 5 - 15  Comment: Performed at [facility], [address] [place], [place] [address]  CBC with Differential     Status: None   Collection Time: 03/21/21  7:46 PM  Result Value Ref Range   WBC 8.0 4.0 - 10.5 K/uL   RBC 3.95 3.87 - 5.11 MIL/uL   Hemoglobin 12.3 12.0 - 15.0 g/dL   HCT 38.1 82.9 - 93.7 %   MCV 91.6 80.0 - 100.0 fL   MCH 31.1 26.0 - 34.0 pg   MCHC 34.0 30.0 - 36.0 g/dL   RDW 16.9 67.8 - 93.8 %   Platelets 228 150 - 400  K/uL   nRBC 0.0 0.0 - 0.2 %   Neutrophils Relative % 59 %   Neutro Abs 4.7 1.7 - 7.7 K/uL   Lymphocytes Relative 31 %   Lymphs Abs 2.5 0.7 - 4.0 K/uL   Monocytes Relative 9 %   Monocytes Absolute 0.7 0.1 - 1.0 K/uL   Eosinophils Relative 1 %   Eosinophils Absolute 0.1 0.0 - 0.5 K/uL   Basophils Relative 0 %   Basophils Absolute 0.0 0.0 - 0.1 K/uL   Immature Granulocytes 0 %   Abs Immature Granulocytes 0.02 0.00 - 0.07 K/uL    Comment: Performed at [facility], [address], [place], [place] [address]    US OB LESS THAN 14 WEEKS WITH [place] TRANSVAGINAL  Result Date: 03/21/2021 CLINICAL DATA:  35 year old pregnant female with vaginal bleeding. LMP: 01/25/2021 corresponding to an estimated gestational age of [redacted] weeks, 6 days. EXAM: OBSTETRIC <14 WK [place] AND TRANSVAGINAL OB US TECHNIQUE: Both transabdominal and transvaginal ultrasound examinations were performed for complete evaluation of the gestation as well as the maternal uterus, adnexal regions, and pelvic cul-de-sac. Transvaginal technique was performed to assess early pregnancy. COMPARISON:  None. FINDINGS: The uterus is anteverted. There is slight heterogeneity the uterus with findings suggestive of adenomyosis. The endometrium measures approximately 1 cm in thickness. Small amount of complex fluid noted within the endometrium, likely blood product. The ovaries are unremarkable. There is and ectopic pregnancy in the left adnexa adjacent to the left ovary. The gestational sac of the ectopic pregnancy measures 2.9 x 3.2 x 2.5 cm with a mean sac diameter of 2.9 cm. A fetal pole is noted within the ectopic pregnancy. No fetal cardiac activity identified. The estimated gestational age based on crown-rump length of 21 mm is 8 weeks, 5 days. Small amount of blood product noted in the left adnexa and cul-de-sac. IMPRESSION: Left adnexal ectopic pregnancy as above. No fetal cardiac activity identified. Obstetrical consult is advised. These  results were called by telephone at the time of interpretation on 03/21/2021 at 7:18 pm to provider [facility] , who verbally acknowledged these results. Electronically Signed   By: Elgie Collard M.D.   On: 03/21/2021 19:21    Assessment/Plan: 1. Left ectopic pregnancy  - 35 y.o. [id number] with left ectopic pregnancy.   On exam, she had stable vital signs, and ultrasound which notes left ectopic pregnancy dating ~ 8 weeks (however hormone levels noting pregnancy of ~ 3-5 weeks). Risk factors include previous STI's, as well as current Nexplanon in place. Patient was counseled regarding options of expectant management with serial BHCG levels, vs Methotrexate (less likely to be effective at current hormone level), or surgical intervention with laparoscopy. Discussed salpingostomy vs salpingectomy, patient prefers salpingectomy. Risks of surgery including bleeding which may require transfusion or reoperation, infection, injury to bowel or other surrounding organs, need for additional procedures  including (laparoscopy or) laparotomy were explained to patient and written informed consent was obtained.  Patient has been NPO since 12 pm and she will remain NPO for procedure. Anesthesia and OR aware.  Preoperative SCDs ordered on call to the OR.  To OR when ready.  2. Rh negative - Patient has received Rhogam in the ER.   3. Hypertension  - Discussed likely need for patient to be on anti-hypertensive (especially in light of fact that her sister is recently deceased from unknown cause, occurred last weekend, but was last noting issues with SOB, as well as race and obesity).  Patient notes she will consider.   4. Contraception  -Discussion had with patient regarding contraception management.  Currently has a Nexplanon in place with failed pregnancy.  Is due for removal of Nexplanon in July.  Discussed option of removal of Nexplanon at time of ectopic surgery and either replacement with new Nexplanon or  consideration of alternative contraception.  Patient currently undecided.  Notes that she would likely not have Nexplanon removed at this time.  5. H/o gonorrhea infection -Patient with recent gonorrhea infection in April.  She reports treatment, however is unsure if her partner was ever treated.  She does report that she has not had further sexual contact with partner since diagnosis.  Will perform test of cure today.   Hildred Laser, MD [facility] 03/21/2021

## 2021-03-21 NOTE — Transfer of Care (Signed)
Immediate Anesthesia Transfer of Care Note  Patient: Jill [name]  Procedure(s) Performed: DIAGNOSTIC LAPAROSCOPY WITH REMOVAL OF ECTOPIC PREGNANCY (Left )  Patient Location: PACU  Anesthesia Type:General  Level of Consciousness: sedated  Airway & Oxygen Therapy: Patient Spontanous Breathing and Patient connected to face mask oxygen  Post-op Assessment: Report given to RN and Post -op Vital signs reviewed and stable  Post vital signs: Reviewed and stable  Last Vitals:  Vitals Value Taken Time  BP 128/98 03/21/21 2342  Temp    Pulse 86 03/21/21 2342  Resp 20 03/21/21 2342  SpO2 100 % 03/21/21 2342    Last Pain:  Vitals:   03/21/21 2342  TempSrc:   PainSc: 0-No pain         Complications: No complications documented.

## 2021-03-22 ENCOUNTER — Encounter: Payer: Self-pay | Admitting: Obstetrics and Gynecology

## 2021-03-22 LAB — RHOGAM INJECTION: Unit division: 0

## 2021-03-22 MED ORDER — LIDOCAINE 5 % EX PTCH
1.0000 | MEDICATED_PATCH | CUTANEOUS | Status: DC
  Administered 2021-03-22: 1 via TRANSDERMAL
  Filled 2021-03-22: qty 1

## 2021-03-22 MED ORDER — ZOLPIDEM TARTRATE 5 MG PO TABS: 5.0000 mg | ORAL_TABLET | Freq: Every evening | ORAL | Status: DC | PRN

## 2021-03-22 MED ORDER — PANTOPRAZOLE SODIUM 40 MG PO TBEC
40.0000 mg | DELAYED_RELEASE_TABLET | Freq: Every day | ORAL | Status: DC
  Administered 2021-03-22: 40 mg via ORAL
  Filled 2021-03-22: qty 1

## 2021-03-22 MED ORDER — KETOROLAC TROMETHAMINE 30 MG/ML IJ SOLN: 30.0000 mg | Freq: Four times a day (QID) | INTRAMUSCULAR | Status: DC

## 2021-03-22 MED ORDER — PHENYLEPHRINE HCL (PRESSORS) 10 MG/ML IV SOLN
INTRAVENOUS | Status: AC
  Filled 2021-03-22: qty 2

## 2021-03-22 MED ORDER — LACTATED RINGERS IV SOLN: INTRAVENOUS | Status: DC

## 2021-03-22 MED ORDER — BISACODYL 10 MG RE SUPP: 10.0000 mg | Freq: Every day | RECTAL | Status: DC | PRN

## 2021-03-22 MED ORDER — ACETAMINOPHEN 500 MG PO TABS: 1000.0000 mg | ORAL_TABLET | Freq: Four times a day (QID) | ORAL | Status: DC

## 2021-03-22 MED ORDER — MENTHOL 3 MG MT LOZG
1.0000 | LOZENGE | OROMUCOSAL | Status: DC | PRN
  Filled 2021-03-22: qty 9

## 2021-03-22 MED ORDER — OXYCODONE-ACETAMINOPHEN 5-325 MG PO TABS: 1.0000 | ORAL_TABLET | ORAL | 0 refills | Status: DC | PRN

## 2021-03-22 MED ORDER — SIMETHICONE 80 MG PO CHEW
80.0000 mg | CHEWABLE_TABLET | Freq: Four times a day (QID) | ORAL | Status: DC | PRN
  Administered 2021-03-22: 80 mg via ORAL
  Filled 2021-03-22: qty 1

## 2021-03-22 MED ORDER — DOCUSATE SODIUM 100 MG PO CAPS: 100.0000 mg | ORAL_CAPSULE | Freq: Two times a day (BID) | ORAL | Status: DC

## 2021-03-22 MED ORDER — DOCUSATE SODIUM 100 MG PO CAPS
100.0000 mg | ORAL_CAPSULE | Freq: Two times a day (BID) | ORAL | Status: DC
  Administered 2021-03-22: 100 mg via ORAL
  Filled 2021-03-22: qty 1

## 2021-03-22 MED ORDER — MAGNESIUM CITRATE PO SOLN
1.0000 | Freq: Once | ORAL | Status: DC | PRN
  Filled 2021-03-22: qty 296

## 2021-03-22 MED ORDER — OXYCODONE HCL 5 MG PO TABS
5.0000 mg | ORAL_TABLET | ORAL | Status: DC | PRN
  Administered 2021-03-22 (×2): 10 mg via ORAL
  Filled 2021-03-22 (×2): qty 2

## 2021-03-22 MED ORDER — ACETAMINOPHEN 500 MG PO TABS
1000.0000 mg | ORAL_TABLET | Freq: Four times a day (QID) | ORAL | Status: DC
  Administered 2021-03-22: 1000 mg via ORAL
  Filled 2021-03-22: qty 2

## 2021-03-22 MED ORDER — SENNOSIDES-DOCUSATE SODIUM 8.6-50 MG PO TABS: 1.0000 | ORAL_TABLET | Freq: Every evening | ORAL | Status: DC | PRN

## 2021-03-22 MED ORDER — SIMETHICONE 80 MG PO CHEW: 80.0000 mg | CHEWABLE_TABLET | Freq: Four times a day (QID) | ORAL | 0 refills | Status: DC | PRN

## 2021-03-22 MED ORDER — IBUPROFEN 800 MG PO TABS: 800.0000 mg | ORAL_TABLET | Freq: Three times a day (TID) | ORAL | 0 refills | Status: DC | PRN

## 2021-03-22 MED ORDER — DOCUSATE SODIUM 100 MG PO CAPS: 100.0000 mg | ORAL_CAPSULE | Freq: Two times a day (BID) | ORAL | 0 refills | Status: DC | PRN

## 2021-03-22 MED ORDER — ONDANSETRON HCL 4 MG PO TABS: 4.0000 mg | ORAL_TABLET | Freq: Four times a day (QID) | ORAL | Status: DC | PRN

## 2021-03-22 MED ORDER — ONDANSETRON HCL 4 MG/2ML IJ SOLN: 4.0000 mg | Freq: Four times a day (QID) | INTRAMUSCULAR | Status: DC | PRN

## 2021-03-22 MED ORDER — KETOROLAC TROMETHAMINE 30 MG/ML IJ SOLN
30.0000 mg | Freq: Four times a day (QID) | INTRAMUSCULAR | Status: DC
  Administered 2021-03-22: 30 mg via INTRAVENOUS
  Filled 2021-03-22: qty 1

## 2021-03-22 MED ORDER — HYDROMORPHONE HCL 1 MG/ML IJ SOLN
0.2000 mg | INTRAMUSCULAR | Status: DC | PRN
  Administered 2021-03-22 (×2): 0.6 mg via INTRAVENOUS
  Filled 2021-03-22 (×2): qty 1

## 2021-03-22 MED ORDER — ALUM & MAG HYDROXIDE-SIMETH 200-200-20 MG/5ML PO SUSP: 30.0000 mL | ORAL | Status: DC | PRN

## 2021-03-22 NOTE — Progress Notes (Signed)
Post-Operative Day # 1, s/p laparoscopic left salpingectomy for treatment of ectopic pregnancy.  Subjective: up ad lib, voiding and tolerating PO.  She has not passed flatus or had BM yet.  She does report pain at left port site.  Objective: Temp:  [98.2 F (36.8 C)-98.6 F (37 C)] 98.5 F (36.9 C) (05/31 0824) Pulse Rate:  [71-120] 75 (05/31 0824) Resp:  [15-20] 15 (05/31 0543) BP: (113-148)/(67-98) 134/88 (05/31 0824) SpO2:  [91 %-100 %] 100 % (05/31 0824)  Physical Exam:  General: alert and no distress  Lungs: clear to auscultation bilaterally Heart: regular rate and rhythm, S1, S2 normal, no murmur, click, rub or gallop Abdomen: soft, non-tender; bowel sounds normal; no masses,  no organomegaly Pelvis:Bleeding: appropriate,  Incision: healing well, no significant drainage, no dehiscence, no significant erythema Extremities: DVT Evaluation: No evidence of DVT seen on physical exam. Negative Homan's sign. No cords or calf tenderness. No significant calf/ankle edema.    Recent Labs    03/21/21 1946  HGB 12.3  HCT 36.2     Lab Results  Component Value Date   CREATININE 0.46 03/21/2021     Assessment/Plan: Doing well postoperatively. Advance diet Advance to PO pain medication  Reviewed operative findings with patient.  Received Rhogam in ER for Rh negative status HTN labile currently. Not on any medications.  Can discharge home today.        Hildred Laser, MD [facility]

## 2021-03-22 NOTE — Anesthesia Postprocedure Evaluation (Signed)
Anesthesia Post Note  Patient: Jill [name]  Procedure(s) Performed: DIAGNOSTIC LAPAROSCOPY WITH REMOVAL OF ECTOPIC PREGNANCY (Left )  Patient location during evaluation: PACU Anesthesia Type: General Level of consciousness: awake and alert Pain management: pain level controlled Vital Signs Assessment: post-procedure vital signs reviewed and stable Respiratory status: spontaneous breathing, nonlabored ventilation, respiratory function stable and patient connected to nasal cannula oxygen Cardiovascular status: blood pressure returned to baseline and stable Postop Assessment: no apparent nausea or vomiting Anesthetic complications: no   No complications documented.   Last Vitals:  Vitals:   03/22/21 0219 03/22/21 0543  BP:  134/90  Pulse: 77 78  Resp:  15  Temp:  37 C  SpO2: 96% 93%    Last Pain:  Vitals:   03/22/21 0620  TempSrc:   PainSc: 4                  Corinda Gubler

## 2021-03-22 NOTE — Discharge Summary (Signed)
Gynecology Physician Postoperative Discharge Summary  Patient ID: Jill [name] MRN: [id number] DOB/AGE: [birth year] 35 y.o.  Admit Date: 03/21/2021 Discharge Date: 03/22/2021  Preoperative Diagnoses:  1. Miscarriage of left tubal ectopic pregnancy   2. Bleeding   3. Rh negative state in antepartum period   4. Morbid obesity (HCC) 246 lbs   5. Hypertension, unspecified type      Procedures: Procedure(s) (LRB): DIAGNOSTIC LAPAROSCOPY WITH REMOVAL OF ECTOPIC PREGNANCY (Left)  Hospital Course:  Jill [name] is a 35 y.o. [id number]  admitted from the Emergency Room due to findings of left ectopic pregnancy.  She underwent the procedures as mentioned above, her operation was uncomplicated. For further details about surgery, please refer to the operative report. Patient had an uncomplicated postoperative course. By time of discharge on POD#1, her pain was controlled on oral pain medications; she was ambulating, voiding without difficulty, tolerating regular diet and passing flatus. She was deemed stable for discharge to home.   Significant Labs: CBC Latest Ref Rng & Units 03/21/2021 03/22/2017  WBC 4.0 - 10.5 K/uL 8.0 CANCELED  Hemoglobin 12.0 - 15.0 g/dL 92.1 -  Hematocrit 19.4 - 46.0 % 36.2 -  Platelets 150 - 400 K/uL 228 -    Discharge Exam: Blood pressure 134/88, pulse 75, temperature 98.5 F (36.9 C), temperature source Oral, resp. rate 15, last menstrual period 11/20/2020, SpO2 100 %. General appearance: alert and no distress  Resp: clear to auscultation bilaterally  Cardio: regular rate and rhythm  GI: soft, non-tender; bowel sounds normal; no masses, no organomegaly.  Incision: C/D/I, no erythema, no drainage noted Pelvic: scant blood on pad  Extremities: extremities normal, atraumatic, no cyanosis or edema and Homans sign is negative, no sign of DVT  Discharged Condition: Stable  Disposition: Discharge disposition: 01-Home or Self Care       Discharge  Instructions    Discharge patient   Complete by: As directed    Discharge disposition: 01-Home or Self Care   Discharge patient date: 03/22/2021     Allergies as of 03/22/2021   No Known Allergies     Medication List    TAKE these medications   docusate sodium 100 MG capsule Commonly known as: COLACE Take 1 capsule (100 mg total) by mouth 2 (two) times daily as needed for mild constipation.   ibuprofen 800 MG tablet Commonly known as: ADVIL Take 1 tablet (800 mg total) by mouth every 8 (eight) hours as needed.   Nexplanon 68 MG Impl implant Generic drug: etonogestrel 1 each by Subdermal route once.   oxyCODONE-acetaminophen 5-325 MG tablet Commonly known as: Percocet Take 1 tablet by mouth every 4 (four) hours as needed for severe pain.   simethicone 80 MG chewable tablet Commonly known as: Gas-X Chew 1 tablet (80 mg total) by mouth 4 (four) times daily as needed for flatulence.      No future appointments.  Follow-up Information    Schedule an appointment as soon as possible for a visit with Hildred Laser, MD.   Specialties: Obstetrics and Gynecology, Radiology Why: 10-14 days, For wound re-check Contact information: [address] [place] [address] [phone]               Total discharge time: 15 minutes   Signed:   Hildred Laser, MD [facility]

## 2021-03-22 NOTE — Progress Notes (Signed)
Patient discharged home with family.  Discharge instructions, when to follow up, and prescriptions reviewed with patient.  Patient verbalized understanding. Patient will be escorted out by auxiliary.   

## 2021-03-23 LAB — SURGICAL PATHOLOGY

## 2021-04-01 ENCOUNTER — Encounter: Payer: Medicaid Other | Admitting: Obstetrics and Gynecology

## 2021-04-18 ENCOUNTER — Ambulatory Visit: Payer: Medicaid Other

## 2021-04-22 ENCOUNTER — Encounter: Payer: Self-pay | Admitting: Obstetrics and Gynecology

## 2021-04-27 ENCOUNTER — Ambulatory Visit: Payer: Medicaid Other

## 2021-04-27 ENCOUNTER — Other Ambulatory Visit: Payer: Self-pay

## 2021-04-27 ENCOUNTER — Ambulatory Visit (LOCAL_COMMUNITY_HEALTH_CENTER): Payer: Medicaid Other | Admitting: Advanced Practice Midwife

## 2021-04-27 DIAGNOSIS — Z3009 Encounter for other general counseling and advice on contraception: Secondary | ICD-10-CM

## 2021-04-27 DIAGNOSIS — Z30017 Encounter for initial prescription of implantable subdermal contraceptive: Secondary | ICD-10-CM | POA: Diagnosis not present

## 2021-04-27 DIAGNOSIS — I159 Secondary hypertension, unspecified: Secondary | ICD-10-CM

## 2021-04-27 DIAGNOSIS — O00102 Left tubal pregnancy without intrauterine pregnancy: Secondary | ICD-10-CM

## 2021-04-27 DIAGNOSIS — Z3046 Encounter for surveillance of implantable subdermal contraceptive: Secondary | ICD-10-CM

## 2021-04-27 DIAGNOSIS — O039 Complete or unspecified spontaneous abortion without complication: Secondary | ICD-10-CM

## 2021-04-27 LAB — WET PREP FOR TRICH, YEAST, CLUE
Trichomonas Exam: NEGATIVE
Yeast Exam: NEGATIVE

## 2021-04-27 MED ORDER — ETONOGESTREL 68 MG ~~LOC~~ IMPL
68.0000 mg | DRUG_IMPLANT | Freq: Once | SUBCUTANEOUS | Status: AC
  Administered 2021-04-27: 68 mg via SUBCUTANEOUS

## 2021-04-27 NOTE — Progress Notes (Signed)
[facility] DEPARTMENT [facility] [address] Main Number: [phone]    Family Planning Visit- Initial Visit  Subjective:  Jill [name] Jill [name] [name] is a 35 y.o. SBF nonsmoker [id number] (16,9,6)  being seen today for an initial annual visit and to discuss contraceptive options.  The Jill [name] [name] is currently using Nexplanon for pregnancy prevention. Jill [name] [name] reports Jill [name] does not want a pregnancy in the next year.  Jill [name] [name] has the following medical conditions has Hypertension 137/91 on 04/27/21; Morbid obesity (HCC) 246 lbs BMI=34.6; Rh negative state in antepartum period; Miscarriage of left tubal ectopic pregnancy; History of gonorrhea; History of chlamydia; and Nexplanon in place on their problem list.  Chief Complaint  Jill [name] [name] presents with   Annual Exam   Contraception    Nexplanon removal and reinsertion    Jill [name] [name] reports here for physical, Nexplanon removal/reinsertion. Nexplanon inserted 03/29/18. Ectopic pregnancy removed via lap on 03/21/21. Last sex 01/20/21 with condom; with current partner x 3 mo; 1 partner in last 3 mo. Last PE 03/29/18. Last pap 03/29/18 neg HPV neg. LMP 04/19/21. Last ETOH 11/2020 (1 coctail in a can) qo month. Working 30-35 hours/wk. Living with her 3 kids.  Jill [name] [name] denies cigs, vaping, cigars, MJ   Body mass index is 34.61 kg/m. - Jill [name] [name] is eligible for diabetes screening based on BMI and age >35?  not applicable HA1C ordered? not applicable  Jill [name] [name] reports 2  partner/s in last year. Desires STI screening?  Yes  Has Jill [name] [name] been screened once for HCV in the past?  No  No results found for: HCVAB  Does the Jill [name] [name] have current drug use (including MJ), have a partner with drug use, and/or has been incarcerated since last result? No  If yes-- Screen for HCV through [facility]   Does the Jill [name] [name] meet criteria for HBV testing? No  Criteria:  -Household, sexual or needle sharing contact with HBV -History of drug use -HIV  positive -Those with known Hep C   Health Maintenance Due  Topic Date Due   COVID-19 Vaccine (1) Never done   Hepatitis C Screening  Never done   TETANUS/TDAP  Never done    Review of Systems  All other systems reviewed and are negative.  The following portions of the Jill [name] [name]'s history were reviewed and updated as appropriate: allergies, current medications, past family history, past medical history, past social history, past surgical history and problem list. Problem list updated.   See flowsheet for other program required questions.  Objective:   Vitals:   04/27/21 1408  BP: (!) 137/91  Weight: 214 lb 6.4 oz (97.3 kg)  Height: 5\' 6"  (1.676 m)    Physical Exam Constitutional:      Appearance: Normal appearance. Jill [name] is obese.  HENT:     Head: Normocephalic and atraumatic.     Mouth/Throat:     Mouth: Mucous membranes are moist.     Comments: Last dental exam >20 years ago; encouraged exam asap Eyes:     Conjunctiva/sclera: Conjunctivae normal.  Neck:     Thyroid: No thyroid mass, thyromegaly or thyroid tenderness.  Cardiovascular:     Rate and Rhythm: Normal rate and regular rhythm.  Pulmonary:     Effort: Pulmonary effort is normal.     Breath sounds: Normal breath sounds.  Chest:  Breasts:    Right: Normal.     Left: Normal.  Abdominal:     Palpations: Abdomen is soft.     Comments: Poor tone, soft without masses  or tenderness, increased adipose  Genitourinary:    General: Normal vulva.     Exam position: Lithotomy position.     Vagina: Vaginal discharge (grey creamy leukorrhea, ph<4.5) present.     Cervix: Normal.     Rectum: Normal.     Comments: Difficult to assess due to increased adipose Musculoskeletal:        General: Normal range of motion.     Cervical back: Normal range of motion and neck supple.  Skin:    General: Skin is warm and dry.  Neurological:     Mental Status: Jill [name] is alert.  Psychiatric:        Mood and Affect: Mood normal.       Assessment and Plan:  Jill [name] Jill [name] [name] is a 35 y.o. female presenting to the [facility] Department for an initial annual wellness/contraceptive visit  Contraception counseling: Reviewed all forms of birth control options in the tiered based approach. available including abstinence; over the counter/barrier methods; hormonal contraceptive medication including pill, patch, ring, injection,contraceptive implant, ECP; hormonal and nonhormonal IUDs; permanent sterilization options including vasectomy and the various tubal sterilization modalities. Risks, benefits, and typical effectiveness rates were reviewed.  Questions were answered.  Written information was also given to the Jill [name] [name] to review.  Jill [name] [name] desires Nexplanon removal/reinsertion, this was prescribed for Jill [name] [name]. Jill [name] will follow up in  prn for surveillance.  Jill [name] was told to call with any further questions, or with any concerns about this method of contraception.  Emphasized use of condoms 100% of the time for STI prevention.  Jill [name] [name] was offered ECP.  ECP was not accepted by the Jill [name] [name]. ECP counseling was not given - see RN documentation  1. Miscarriage of left tubal ectopic pregnancy 03/21/21   2. Morbid obesity (HCC) 246 lbs BMI=34.6   3. Secondary hypertension Referred to priimary care MD  4. Family planning Treat wet mount per standing orders Immunization nurse consult Please give dental list to pt - WET PREP FOR TRICH, YEAST, CLUE - Chlamydia/Gonorrhea [facility]  5. Nexplanon removal Nexplanon Removal Jill [name] [name] identified, informed consent performed, consent signed.   Appropriate time out taken. Nexplanon site identified.  Area prepped in usual sterile fashon. 3 ml of 1% lidocaine with Epinephrine was used to anesthetize the area at the distal end of the implant and along implant site. A small stab incision was made right beside the implant on the distal portion.  The Nexplanon rod was grasped using  straight hemostats/manual and removed without difficulty.  There was minimal blood loss. There were no complications.  Steri-strips were applied over the small incision.  A pressure bandage was applied to reduce any bruising.  The Jill [name] [name] tolerated the procedure well and was given post procedure instructions.     6. Nexplanon insertion Nexplanon Insertion Procedure Jill [name] [name] identified, informed consent performed, consent signed.   Jill [name] [name] does understand that irregular bleeding is a very common side effect of this medication. Jill [name] was advised to have backup contraception after placement. Jill [name] [name] was determined to meet WHO criteria for not being pregnant. Appropriate time out taken.  The insertion site was identified 8-10 cm (3-4 inches) from the medial epicondyle of the humerus and 3-5 cm (1.25-2 inches) posterior to (below) the sulcus (groove) between the biceps and triceps muscles of the Jill [name] [name]'s left arm and marked.  Jill [name] [name] was prepped with alcohol swab and then injected with 3 ml of 1% lidocaine.  Arm was prepped with chlorhexidene, Nexplanon removed from packaging,  Device confirmed in  needle, then inserted full length of needle and withdrawn per handbook instructions. Nexplanon was able to palpated in the Jill [name] [name]'s arm; Jill [name] [name] palpated the insert herself. There was minimal blood loss.  Jill [name] [name] insertion site covered with guaze and a pressure bandage to reduce any bruising.  The Jill [name] [name] tolerated the procedure well and was given post procedure instructions.   Nexplanon:   Counseled Jill [name] [name] to take OTC analgesic starting as soon as lidocaine starts to wear off and take regularly for at least 48 hr to decrease discomfort.  Specifically to take with food or milk to decrease stomach upset and for IB 600 mg (3 tablets) every 6 hrs; IB 800 mg (4 tablets) every 8 hrs; or Aleve 2 tablets every 12 hrs.    7. Encounter for surveillance of implantable subdermal contraceptive Counseled abstinance/back up  condoms next 7 days     No follow-ups on file.  Future Appointments  Date Time Provider Department Center  05/16/2021  2:20 PM Larae Grooms, NP [facility]    Alberteen Spindle, CNM

## 2021-04-27 NOTE — Progress Notes (Signed)
Pt to clinic for physical, STD checks, and nexplanon removal and reinsertion. Pt states she has not had sex since her surgery due to ectopic pregnancy. Condoms accepted.

## 2021-04-27 NOTE — Progress Notes (Signed)
Wet mount reviewed, no tx per provider orders. Provider orders completed. 

## 2021-05-16 ENCOUNTER — Other Ambulatory Visit: Payer: Self-pay

## 2021-05-16 ENCOUNTER — Encounter: Payer: Self-pay | Admitting: Nurse Practitioner

## 2021-05-16 ENCOUNTER — Ambulatory Visit (INDEPENDENT_AMBULATORY_CARE_PROVIDER_SITE_OTHER): Payer: Medicaid Other | Admitting: Nurse Practitioner

## 2021-05-16 VITALS — BP 139/85 | HR 97 | Temp 98.7°F | Ht 65.0 in | Wt 213.8 lb

## 2021-05-16 DIAGNOSIS — I159 Secondary hypertension, unspecified: Secondary | ICD-10-CM | POA: Diagnosis not present

## 2021-05-16 DIAGNOSIS — Z7689 Persons encountering health services in other specified circumstances: Secondary | ICD-10-CM | POA: Diagnosis not present

## 2021-05-16 MED ORDER — AMLODIPINE BESYLATE 5 MG PO TABS: 5.0000 mg | ORAL_TABLET | Freq: Every day | ORAL | 0 refills | Status: DC

## 2021-05-16 NOTE — Assessment & Plan Note (Signed)
Chronic. Not well controlled. Will begin Amlodipine 5mg . Discussed side effects and benefits of medication with patient during visit today. Follow up in 1 month for reevaluation. Recommend checking blood pressures at home.

## 2021-05-16 NOTE — Progress Notes (Signed)
BP 139/85   Pulse 97   Temp 98.7 F (37.1 C) (Oral)   Ht 5\' 5"  (1.651 m)   Wt 213 lb 12.8 oz (97 kg)   LMP 04/18/2021 (Approximate) Comment: normal  SpO2 97%   BMI 35.58 kg/m    Subjective:    Patient ID: Jill [name], female    DOB: [birth year], 35 y.o.   MRN: [id number]  HPI: Jill [name] is a 34 y.o. female  Chief Complaint  Patient presents with   Establish Care    Pt states she wants to discuss her BP. States it has always been a little elevated but has not been on any medications   Patient presents to clinic to establish care with new PCP.  Patient reports a history of hypertension.  Denies any significant past medical history.  Has never been on medication for blood pressure.   Patient denies a history of: Hypertension, Elevated Cholesterol, Diabetes, Thyroid problems, Depression, Anxiety, Neurological problems, and Abdominal problems.    Denies HA, CP, SOB, dizziness, palpitations, visual changes, and lower extremity swelling.  Active Ambulatory Problems    Diagnosis Date Noted   Hypertension 137/91 on 04/27/21 03/22/2016   Morbid obesity (HCC) 246 lbs BMI=34.6 03/12/2020   Rh negative state in antepartum period 03/21/2021   Miscarriage of left tubal ectopic pregnancy 03/21/2021   History of gonorrhea 04/11/2020   History of chlamydia 01/22/2021   Nexplanon in place 03/21/2021   Resolved Ambulatory Problems    Diagnosis Date Noted   No Resolved Ambulatory Problems   No Additional Past Medical History   Family History  Problem Relation Age of Onset   Hypertension Father    Diabetes Father    Hypertension Mother    Diabetes Mother    Past Surgical History:  Procedure Laterality Date   DIAGNOSTIC LAPAROSCOPY WITH REMOVAL OF ECTOPIC PREGNANCY Left 03/21/2021   Procedure: DIAGNOSTIC LAPAROSCOPY WITH REMOVAL OF ECTOPIC PREGNANCY;  Surgeon: 03/23/2021, MD;  Location: [facility] ORS;  Service: Gynecology;  Laterality: Left;   NO PAST SURGERIES       Relevant past medical, surgical, family and social history reviewed and updated as indicated. Interim medical history since our last visit reviewed. Allergies and medications reviewed and updated.  Review of Systems  Eyes:  Negative for visual disturbance.  Respiratory:  Negative for cough, chest tightness and shortness of breath.   Cardiovascular:  Negative for chest pain, palpitations and leg swelling.  Neurological:  Negative for dizziness and headaches.   Per HPI unless specifically indicated above     Objective:    BP 139/85   Pulse 97   Temp 98.7 F (37.1 C) (Oral)   Ht 5\' 5"  (1.651 m)   Wt 213 lb 12.8 oz (97 kg)   LMP 04/18/2021 (Approximate) Comment: normal  SpO2 97%   BMI 35.58 kg/m   Wt Readings from Last 3 Encounters:  05/16/21 213 lb 12.8 oz (97 kg)  04/27/21 214 lb 6.4 oz (97.3 kg)  07/28/19 246 lb (111.6 kg)    Physical Exam Vitals and nursing note reviewed.  Constitutional:      General: She is not in acute distress.    Appearance: Normal appearance. She is normal weight. She is not ill-appearing, toxic-appearing or diaphoretic.  HENT:     Head: Normocephalic.     Right Ear: External ear normal.     Left Ear: External ear normal.     Nose: Nose normal.     Mouth/Throat:  Mouth: Mucous membranes are moist.     Pharynx: Oropharynx is clear.  Eyes:     General:        Right eye: No discharge.        Left eye: No discharge.     Extraocular Movements: Extraocular movements intact.     Conjunctiva/sclera: Conjunctivae normal.     Pupils: Pupils are equal, round, and reactive to light.  Cardiovascular:     Rate and Rhythm: Normal rate and regular rhythm.     Heart sounds: No murmur heard. Pulmonary:     Effort: Pulmonary effort is normal. No respiratory distress.     Breath sounds: Normal breath sounds. No wheezing or rales.  Musculoskeletal:     Cervical back: Normal range of motion and neck supple.  Skin:    General: Skin is warm and dry.      Capillary Refill: Capillary refill takes less than 2 seconds.  Neurological:     General: No focal deficit present.     Mental Status: She is alert and oriented to person, place, and time. Mental status is at baseline.  Psychiatric:        Mood and Affect: Mood normal.        Behavior: Behavior normal.        Thought Content: Thought content normal.        Judgment: Judgment normal.    Results for orders placed or performed in visit on 04/27/21  WET PREP FOR TRICH, YEAST, CLUE  Result Value Ref Range   Trichomonas Exam Negative Negative   Yeast Exam Negative Negative   Clue Cell Exam Comment: Negative      Assessment & Plan:   Problem List Items Addressed This Visit       Cardiovascular and Mediastinum   Hypertension 137/91 on 04/27/21    Chronic. Not well controlled. Will begin Amlodipine 5mg . Discussed side effects and benefits of medication with patient during visit today. Follow up in 1 month for reevaluation. Recommend checking blood pressures at home.        Relevant Medications   amLODipine (NORVASC) 5 MG tablet   Other Visit Diagnoses     Encounter to establish care    -  Primary        Follow up plan: Return in about 1 month (around 06/16/2021) for Physical and Fasting labs, BP Check.

## 2021-06-13 ENCOUNTER — Other Ambulatory Visit: Payer: Self-pay | Admitting: Nurse Practitioner

## 2021-06-13 NOTE — Telephone Encounter (Signed)
Patient last seen 05/16/21 and has appointment 06/17/21

## 2021-06-13 NOTE — Telephone Encounter (Signed)
  Notes to clinic:  Patient has follow up on medication on 06/17/2021   Requested Prescriptions  Pending Prescriptions Disp Refills   amLODipine (NORVASC) 5 MG tablet [Pharmacy Med Name: AMLODIPINE BESYLATE 5MG  TABLETS] 30 tablet 0    Sig: TAKE 1 TABLET(5 MG) BY MOUTH DAILY     Cardiovascular:  Calcium Channel Blockers Passed - 06/13/2021  3:17 AM      Passed - Last BP in normal range    BP Readings from Last 1 Encounters:  05/16/21 139/85          Passed - Valid encounter within last 6 months    Recent Outpatient Visits           4 weeks ago Encounter to establish care   [facility], NP       Future Appointments             In 4 days Larae Grooms, NP [facility]

## 2021-06-17 ENCOUNTER — Ambulatory Visit (INDEPENDENT_AMBULATORY_CARE_PROVIDER_SITE_OTHER): Payer: Medicaid Other | Admitting: Nurse Practitioner

## 2021-06-17 ENCOUNTER — Other Ambulatory Visit: Payer: Self-pay

## 2021-06-17 ENCOUNTER — Encounter: Payer: Self-pay | Admitting: Nurse Practitioner

## 2021-06-17 VITALS — BP 131/83 | HR 94 | Temp 98.4°F | Ht 64.65 in | Wt 214.0 lb

## 2021-06-17 DIAGNOSIS — Z136 Encounter for screening for cardiovascular disorders: Secondary | ICD-10-CM | POA: Diagnosis not present

## 2021-06-17 DIAGNOSIS — Z23 Encounter for immunization: Secondary | ICD-10-CM

## 2021-06-17 DIAGNOSIS — A599 Trichomoniasis, unspecified: Secondary | ICD-10-CM | POA: Diagnosis not present

## 2021-06-17 DIAGNOSIS — E0590 Thyrotoxicosis, unspecified without thyrotoxic crisis or storm: Secondary | ICD-10-CM

## 2021-06-17 DIAGNOSIS — Z0000 Encounter for general adult medical examination without abnormal findings: Secondary | ICD-10-CM | POA: Diagnosis not present

## 2021-06-17 DIAGNOSIS — Z3200 Encounter for pregnancy test, result unknown: Secondary | ICD-10-CM

## 2021-06-17 DIAGNOSIS — R8290 Unspecified abnormal findings in urine: Secondary | ICD-10-CM | POA: Diagnosis not present

## 2021-06-17 DIAGNOSIS — I159 Secondary hypertension, unspecified: Secondary | ICD-10-CM

## 2021-06-17 DIAGNOSIS — Z1159 Encounter for screening for other viral diseases: Secondary | ICD-10-CM

## 2021-06-17 LAB — URINALYSIS, ROUTINE W REFLEX MICROSCOPIC
Bilirubin, UA: NEGATIVE
Glucose, UA: NEGATIVE
Ketones, UA: NEGATIVE
Nitrite, UA: NEGATIVE
Protein,UA: NEGATIVE
RBC, UA: NEGATIVE
Specific Gravity, UA: 1.02 (ref 1.005–1.030)
Urobilinogen, Ur: 0.2 mg/dL (ref 0.2–1.0)
pH, UA: 5 (ref 5.0–7.5)

## 2021-06-17 LAB — MICROSCOPIC EXAMINATION

## 2021-06-17 LAB — PREGNANCY, URINE: Preg Test, Ur: NEGATIVE

## 2021-06-17 MED ORDER — AMLODIPINE BESYLATE 5 MG PO TABS: ORAL_TABLET | ORAL | 1 refills | Status: DC

## 2021-06-17 MED ORDER — METRONIDAZOLE 500 MG PO TABS: 500.0000 mg | ORAL_TABLET | Freq: Two times a day (BID) | ORAL | 0 refills | Status: AC

## 2021-06-17 NOTE — Assessment & Plan Note (Addendum)
Chronic.  Controlled.  Continue with current medication regimen on Amlodipine.  Labs ordered today.  Refill sent today.  Return to clinic in 6 months for reevaluation.  Call sooner if concerns arise.

## 2021-06-17 NOTE — Progress Notes (Addendum)
BP 131/83   Pulse 94   Temp 98.4 F (36.9 C) (Oral)   Ht 5' 4.65" (1.642 m)   Wt 214 lb (97.1 kg)   SpO2 100%   BMI 36.00 kg/m    Subjective:    Patient ID: Jill [name], female    DOB: 1986-05-16, 35 y.o.   MRN: [id number]  HPI: Jill [name] is a 35 y.o. female presenting on 06/17/2021 for comprehensive medical examination. Current medical complaints include:none  She currently lives with: Menopausal Symptoms: no  HYPERTENSION Hypertension status: controlled  Satisfied with current treatment? no Duration of hypertension: years BP monitoring frequency:  not checking BP range:  BP medication side effects:  no Medication compliance: excellent compliance Previous BP meds:amlodipine Aspirin: no Recurrent headaches: no Visual changes: no Palpitations: no Dyspnea: no Chest pain: no Lower extremity edema: no Dizzy/lightheaded: no  Patient states she feel like she may be pregnant.  Has not taken a home pregnancy test.  Would like to see if she is pregnant through a blood draw.  She has the nexplanon and does not have periods. Depression Screen done today and results listed below:  Depression screen [facility] 2/9 06/17/2021 05/16/2021 04/27/2021  Decreased Interest 0 0 0  Down, Depressed, Hopeless 0 0 0  PHQ - 2 Score 0 0 0  Altered sleeping 0 - -  Tired, decreased energy 0 - -  Change in appetite 0 - -  Feeling bad or failure about yourself  0 - -  Trouble concentrating 0 - -  Moving slowly or fidgety/restless 0 - -  Suicidal thoughts 0 - -  PHQ-9 Score 0 - -  Difficult doing work/chores Not difficult at all - -    The patient does not have a history of falls. I did complete a risk assessment for falls. A plan of care for falls was documented.   Past Medical History:  Past Medical History:  Diagnosis Date   History of chlamydia 01/22/2021   History of gonorrhea 04/11/2020   Hypertension 03/22/2016    Surgical History:  Past Surgical History:  Procedure  Laterality Date   DIAGNOSTIC LAPAROSCOPY WITH REMOVAL OF ECTOPIC PREGNANCY Left 03/21/2021   Procedure: DIAGNOSTIC LAPAROSCOPY WITH REMOVAL OF ECTOPIC PREGNANCY;  Surgeon: Hildred Laser, MD;  Location: [facility] ORS;  Service: Gynecology;  Laterality: Left;   NO PAST SURGERIES      Medications:  Current Outpatient Medications on File Prior to Visit  Medication Sig   etonogestrel (NEXPLANON) 68 MG IMPL implant 1 each by Subdermal route once.   No current facility-administered medications on file prior to visit.    Allergies:  No Known Allergies  Social History:  Social History   Socioeconomic History   Marital status: Single    Spouse name: Not on file   Number of children: Not on file   Years of education: Not on file   Highest education level: Not on file  Occupational History   Not on file  Tobacco Use   Smoking status: Former    Types: Cigarettes    Start date: 10/23/2010   Smokeless tobacco: Never   Tobacco comments:    Stopped about 1 month ago  Vaping Use   Vaping Use: Never used  Substance and Sexual Activity   Alcohol use: Yes    Alcohol/week: 1.0 standard drink    Types: 1 Standard drinks or equivalent per week    Comment: last use 11/2020   Drug use: Never   Sexual activity: Yes  Partners: Male    Birth control/protection: Implant  Other Topics Concern   Not on file  Social History Narrative   Not on file   Social Determinants of Health   Financial Resource Strain: Not on file  Food Insecurity: Not on file  Transportation Needs: Not on file  Physical Activity: Not on file  Stress: Not on file  Social Connections: Not on file  Intimate Partner Violence: Not At Risk   Fear of Current or Ex-Partner: No   Emotionally Abused: No   Physically Abused: No   Sexually Abused: No   Social History   Tobacco Use  Smoking Status Former   Types: Cigarettes   Start date: 10/23/2010  Smokeless Tobacco Never  Tobacco Comments   Stopped about 1 month ago    Social History   Substance and Sexual Activity  Alcohol Use Yes   Alcohol/week: 1.0 standard drink   Types: 1 Standard drinks or equivalent per week   Comment: last use 11/2020    Family History:  Family History  Problem Relation Age of Onset   Hypertension Father    Diabetes Father    Hypertension Mother    Diabetes Mother     Past medical history, surgical history, medications, allergies, family history and social history reviewed with patient today and changes made to appropriate areas of the chart.   Review of Systems  Eyes:  Negative for blurred vision and double vision.  Respiratory:  Negative for shortness of breath.   Cardiovascular:  Negative for chest pain, palpitations and leg swelling.  Neurological:  Negative for dizziness and headaches.  All other ROS negative except what is listed above and in the HPI.      Objective:    BP 131/83   Pulse 94   Temp 98.4 F (36.9 C) (Oral)   Ht 5' 4.65" (1.642 m)   Wt 214 lb (97.1 kg)   SpO2 100%   BMI 36.00 kg/m   Wt Readings from Last 3 Encounters:  06/17/21 214 lb (97.1 kg)  05/16/21 213 lb 12.8 oz (97 kg)  04/27/21 214 lb 6.4 oz (97.3 kg)    Physical Exam Vitals and nursing note reviewed.  Constitutional:      General: She is awake. She is not in acute distress.    Appearance: She is well-developed. She is not ill-appearing.  HENT:     Head: Normocephalic and atraumatic.     Right Ear: Hearing, tympanic membrane, ear canal and external ear normal. No drainage.     Left Ear: Hearing, tympanic membrane, ear canal and external ear normal. No drainage.     Nose: Nose normal.     Right Sinus: No maxillary sinus tenderness or frontal sinus tenderness.     Left Sinus: No maxillary sinus tenderness or frontal sinus tenderness.     Mouth/Throat:     Mouth: Mucous membranes are moist.     Pharynx: Oropharynx is clear. Uvula midline. No pharyngeal swelling, oropharyngeal exudate or posterior oropharyngeal erythema.   Eyes:     General: Lids are normal.        Right eye: No discharge.        Left eye: No discharge.     Extraocular Movements: Extraocular movements intact.     Conjunctiva/sclera: Conjunctivae normal.     Pupils: Pupils are equal, round, and reactive to light.     Visual Fields: Right eye visual fields normal and left eye visual fields normal.  Neck:  Thyroid: No thyromegaly.     Vascular: No carotid bruit.     Trachea: Trachea normal.  Cardiovascular:     Rate and Rhythm: Normal rate and regular rhythm.     Heart sounds: Normal heart sounds. No murmur heard.   No gallop.  Pulmonary:     Effort: Pulmonary effort is normal. No accessory muscle usage or respiratory distress.     Breath sounds: Normal breath sounds.  Chest:  Breasts:    Right: Normal.     Left: Normal.  Abdominal:     General: Bowel sounds are normal.     Palpations: Abdomen is soft. There is no hepatomegaly or splenomegaly.     Tenderness: There is no abdominal tenderness.  Musculoskeletal:        General: Normal range of motion.     Cervical back: Normal range of motion and neck supple.     Right lower leg: No edema.     Left lower leg: No edema.  Lymphadenopathy:     Head:     Right side of head: No submental, submandibular, tonsillar, preauricular or posterior auricular adenopathy.     Left side of head: No submental, submandibular, tonsillar, preauricular or posterior auricular adenopathy.     Cervical: No cervical adenopathy.     Upper Body:     Right upper body: No supraclavicular, axillary or pectoral adenopathy.     Left upper body: No supraclavicular, axillary or pectoral adenopathy.  Skin:    General: Skin is warm and dry.     Capillary Refill: Capillary refill takes less than 2 seconds.     Findings: No rash.  Neurological:     Mental Status: She is alert and oriented to person, place, and time.     Cranial Nerves: Cranial nerves are intact.     Gait: Gait is intact.     Deep Tendon  Reflexes: Reflexes are normal and symmetric.     Reflex Scores:      Brachioradialis reflexes are 2+ on the right side and 2+ on the left side.      Patellar reflexes are 2+ on the right side and 2+ on the left side. Psychiatric:        Attention and Perception: Attention normal.        Mood and Affect: Mood normal.        Speech: Speech normal.        Behavior: Behavior normal. Behavior is cooperative.        Thought Content: Thought content normal.        Judgment: Judgment normal.    Results for orders placed or performed in visit on 04/27/21  WET PREP FOR TRICH, YEAST, CLUE  Result Value Ref Range   Trichomonas Exam Negative Negative   Yeast Exam Negative Negative   Clue Cell Exam Comment: Negative      Assessment & Plan:   Problem List Items Addressed This Visit       Cardiovascular and Mediastinum   Hypertension 137/91 on 04/27/21    Chronic.  Controlled.  Continue with current medication regimen on Amlodipine.  Labs ordered today.  Refill sent today.  Return to clinic in 6 months for reevaluation.  Call sooner if concerns arise.        Relevant Medications   amLODipine (NORVASC) 5 MG tablet   Other Visit Diagnoses     Annual physical exam    -  Primary   Health maintenance reviewed during visit today. TDAP  and HPV given.    Relevant Orders   CBC with Differential/Platelet   Comprehensive metabolic panel   Lipid panel   TSH   Urinalysis, Routine w reflex microscopic   Possible pregnancy       Urine pregnancy test negative.  Results discussed with patient during visit.    Relevant Orders   Beta hCG quant (ref lab)   Pregnancy, urine   Need for Tdap vaccination       Relevant Orders   Tdap vaccine greater than or equal to 7yo IM (Completed)   Need for HPV vaccination       Relevant Orders   HPV 9-valent vaccine,Recombinat (Completed)   Encounter for hepatitis C screening test for low risk patient       Relevant Orders   Hepatitis C Antibody         Follow up plan: Return in about 6 months (around 12/18/2021) for BP Check.   LABORATORY TESTING:  - Pap smear: up to date  IMMUNIZATIONS:   - Tdap: Tetanus vaccination status reviewed: last tetanus booster within 10 years. - Influenza: Postponed to flu season - Pneumovax: Not applicable - Prevnar: Not applicable - HPV: Administered today - Zostavax vaccine: Not applicable  SCREENING: -Mammogram: Not applicable  - Colonoscopy: Not applicable  - Bone Density: Not applicable  -Hearing Test: Not applicable  -Spirometry: Not applicable   PATIENT COUNSELING:   Advised to take 1 mg of folate supplement per day if capable of pregnancy.   Sexuality: Discussed sexually transmitted diseases, partner selection, use of condoms, avoidance of unintended pregnancy  and contraceptive alternatives.   Advised to avoid cigarette smoking.  I discussed with the patient that most people either abstain from alcohol or drink within safe limits (<=14/week and <=4 drinks/occasion for males, <=7/weeks and <= 3 drinks/occasion for females) and that the risk for alcohol disorders and other health effects rises proportionally with the number of drinks per week and how often a drinker exceeds daily limits.  Discussed cessation/primary prevention of drug use and availability of treatment for abuse.   Diet: Encouraged to adjust caloric intake to maintain  or achieve ideal body weight, to reduce intake of dietary saturated fat and total fat, to limit sodium intake by avoiding high sodium foods and not adding table salt, and to maintain adequate dietary potassium and calcium preferably from fresh fruits, vegetables, and low-fat dairy products.    stressed the importance of regular exercise  Injury prevention: Discussed safety belts, safety helmets, smoke detector, smoking near bedding or upholstery.   Dental health: Discussed importance of regular tooth brushing, flossing, and dental visits.    NEXT  PREVENTATIVE PHYSICAL DUE IN 1 YEAR. Return in about 6 months (around 12/18/2021) for BP Check.

## 2021-06-17 NOTE — Addendum Note (Signed)
Addended by: Larae Grooms on: 06/17/2021 11:54 AM   Modules accepted: Orders

## 2021-06-17 NOTE — Progress Notes (Signed)
Results discussed with patient over the phone.  Sent in Flagyl to treat patient's Trichomonas.

## 2021-06-18 LAB — COMPREHENSIVE METABOLIC PANEL
ALT: 15 IU/L (ref 0–32)
AST: 24 IU/L (ref 0–40)
Albumin/Globulin Ratio: 2.1 (ref 1.2–2.2)
Albumin: 4 g/dL (ref 3.8–4.8)
Alkaline Phosphatase: 114 IU/L (ref 44–121)
BUN/Creatinine Ratio: 20 (ref 9–23)
BUN: 11 mg/dL (ref 6–20)
Bilirubin Total: 0.4 mg/dL (ref 0.0–1.2)
CO2: 21 mmol/L (ref 20–29)
Calcium: 9.7 mg/dL (ref 8.7–10.2)
Chloride: 105 mmol/L (ref 96–106)
Creatinine, Ser: 0.56 mg/dL — ABNORMAL LOW (ref 0.57–1.00)
Globulin, Total: 1.9 g/dL (ref 1.5–4.5)
Glucose: 94 mg/dL (ref 65–99)
Potassium: 4.6 mmol/L (ref 3.5–5.2)
Sodium: 141 mmol/L (ref 134–144)
Total Protein: 5.9 g/dL — ABNORMAL LOW (ref 6.0–8.5)
eGFR: 122 mL/min/{1.73_m2} (ref >59)

## 2021-06-18 LAB — CBC WITH DIFFERENTIAL/PLATELET
Basophils Absolute: 0 10*3/uL (ref 0.0–0.2)
Basos: 0 %
EOS (ABSOLUTE): 0.1 10*3/uL (ref 0.0–0.4)
Eos: 1 %
Hematocrit: 39.1 % (ref 34.0–46.6)
Hemoglobin: 12.6 g/dL (ref 11.1–15.9)
Immature Grans (Abs): 0 10*3/uL (ref 0.0–0.1)
Immature Granulocytes: 0 %
Lymphocytes Absolute: 2.1 10*3/uL (ref 0.7–3.1)
Lymphs: 35 %
MCH: 28.6 pg (ref 26.6–33.0)
MCHC: 32.2 g/dL (ref 31.5–35.7)
MCV: 89 fL (ref 79–97)
Monocytes Absolute: 0.5 10*3/uL (ref 0.1–0.9)
Monocytes: 8 %
Neutrophils Absolute: 3.4 10*3/uL (ref 1.4–7.0)
Neutrophils: 56 %
Platelets: 255 10*3/uL (ref 150–450)
RBC: 4.4 x10E6/uL (ref 3.77–5.28)
RDW: 11.5 % — ABNORMAL LOW (ref 11.7–15.4)
WBC: 5.9 10*3/uL (ref 3.4–10.8)

## 2021-06-18 LAB — LIPID PANEL
Chol/HDL Ratio: 2.8 ratio (ref 0.0–4.4)
Cholesterol, Total: 106 mg/dL (ref 100–199)
HDL: 38 mg/dL — ABNORMAL LOW (ref >39)
LDL Chol Calc (NIH): 49 mg/dL (ref 0–99)
Triglycerides: 100 mg/dL (ref 0–149)
VLDL Cholesterol Cal: 19 mg/dL (ref 5–40)

## 2021-06-18 LAB — TSH: TSH: <0.005 u[IU]/mL — ABNORMAL LOW (ref 0.450–4.500)

## 2021-06-18 LAB — HEPATITIS C ANTIBODY: Hep C Virus Ab: <0.1 s/co ratio (ref 0.0–0.9)

## 2021-06-19 LAB — URINE CULTURE

## 2021-06-20 NOTE — Addendum Note (Signed)
Addended by: Larae Grooms on: 06/20/2021 10:52 AM   Modules accepted: Orders

## 2021-06-20 NOTE — Progress Notes (Signed)
Please let patient know that her lab work shows that her liver, kidneys, electrolytes, urine, complete blood count, and cholesterol look good.  Patient does have a hyper active thyroid and she needs to see an Endocrinologist for further evaluation and management. I have placed the referral. She has had this for at least 4 years looking back at her blood work.  Please let me know if she has any questions.

## 2021-07-14 ENCOUNTER — Other Ambulatory Visit: Payer: Self-pay | Admitting: Nurse Practitioner

## 2021-09-22 DIAGNOSIS — H5213 Myopia, bilateral: Secondary | ICD-10-CM | POA: Diagnosis not present

## 2021-10-18 DIAGNOSIS — H52223 Regular astigmatism, bilateral: Secondary | ICD-10-CM | POA: Diagnosis not present

## 2021-10-18 DIAGNOSIS — H5213 Myopia, bilateral: Secondary | ICD-10-CM | POA: Diagnosis not present

## 2021-10-26 DIAGNOSIS — E0590 Thyrotoxicosis, unspecified without thyrotoxic crisis or storm: Secondary | ICD-10-CM | POA: Diagnosis not present

## 2021-12-19 ENCOUNTER — Ambulatory Visit: Payer: Medicaid Other | Admitting: Nurse Practitioner

## 2021-12-20 ENCOUNTER — Other Ambulatory Visit: Payer: Self-pay

## 2021-12-20 ENCOUNTER — Encounter: Payer: Self-pay | Admitting: Nurse Practitioner

## 2021-12-20 ENCOUNTER — Ambulatory Visit (INDEPENDENT_AMBULATORY_CARE_PROVIDER_SITE_OTHER): Payer: Medicaid Other | Admitting: Nurse Practitioner

## 2021-12-20 VITALS — BP 146/92 | HR 83 | Temp 98.4°F | Wt 220.0 lb

## 2021-12-20 DIAGNOSIS — I159 Secondary hypertension, unspecified: Secondary | ICD-10-CM

## 2021-12-20 MED ORDER — AMLODIPINE BESYLATE 10 MG PO TABS: 10.0000 mg | ORAL_TABLET | Freq: Every day | ORAL | 1 refills | Status: DC

## 2021-12-20 NOTE — Assessment & Plan Note (Signed)
Chronic.  Uncontrolled.  Will increase Amlodipine to 10mg  daily.  Return to clinic in 1 months for reevaluation.  Call sooner if concerns arise.

## 2021-12-20 NOTE — Progress Notes (Signed)
BP (!) 146/92 (BP Location: Right Arm, Cuff Size: Normal)    Pulse 83    Temp 98.4 F (36.9 C) (Oral)    Wt 220 lb (99.8 kg)    LMP 12/18/2021 (Exact Date)    SpO2 98%    BMI 37.01 kg/m    Subjective:    Patient ID: Jill [name], female    DOB: 1986-08-20, 36 y.o.   MRN: [id number]  HPI: Jill [name] is a 36 y.o. female  Chief Complaint  Patient presents with   Hypertension   HYPERTENSION Hypertension status: uncontrolled  Satisfied with current treatment? no Duration of hypertension: years BP monitoring frequency:  not checking BP range:  BP medication side effects:  no Medication compliance: excellent compliance Previous BP meds: amlodipine Aspirin: no Recurrent headaches: no Visual changes: no Palpitations: no Dyspnea: no Chest pain: no Lower extremity edema: no Dizzy/lightheaded: no     Relevant past medical, surgical, family and social history reviewed and updated as indicated. Interim medical history since our last visit reviewed. Allergies and medications reviewed and updated.  Review of Systems  Eyes:  Negative for visual disturbance.  Respiratory:  Negative for cough, chest tightness and shortness of breath.   Cardiovascular:  Negative for chest pain, palpitations and leg swelling.  Neurological:  Negative for dizziness and headaches.   Per HPI unless specifically indicated above     Objective:    BP (!) 146/92 (BP Location: Right Arm, Cuff Size: Normal)    Pulse 83    Temp 98.4 F (36.9 C) (Oral)    Wt 220 lb (99.8 kg)    LMP 12/18/2021 (Exact Date)    SpO2 98%    BMI 37.01 kg/m   Wt Readings from Last 3 Encounters:  12/20/21 220 lb (99.8 kg)  06/17/21 214 lb (97.1 kg)  05/16/21 213 lb 12.8 oz (97 kg)    Physical Exam Vitals and nursing note reviewed.  Constitutional:      General: She is not in acute distress.    Appearance: Normal appearance. She is obese. She is not ill-appearing, toxic-appearing or diaphoretic.  HENT:      Head: Normocephalic.     Right Ear: External ear normal.     Left Ear: External ear normal.     Nose: Nose normal.     Mouth/Throat:     Mouth: Mucous membranes are moist.     Pharynx: Oropharynx is clear.  Eyes:     General:        Right eye: No discharge.        Left eye: No discharge.     Extraocular Movements: Extraocular movements intact.     Conjunctiva/sclera: Conjunctivae normal.     Pupils: Pupils are equal, round, and reactive to light.  Cardiovascular:     Rate and Rhythm: Normal rate and regular rhythm.     Heart sounds: No murmur heard. Pulmonary:     Effort: Pulmonary effort is normal. No respiratory distress.     Breath sounds: Normal breath sounds. No wheezing or rales.  Musculoskeletal:     Cervical back: Normal range of motion and neck supple.  Skin:    General: Skin is warm and dry.     Capillary Refill: Capillary refill takes less than 2 seconds.  Neurological:     General: No focal deficit present.     Mental Status: She is alert and oriented to person, place, and time. Mental status is at baseline.  Psychiatric:  Mood and Affect: Mood normal.        Behavior: Behavior normal.        Thought Content: Thought content normal.        Judgment: Judgment normal.    Results for orders placed or performed in visit on 06/17/21  Urine Culture   Specimen: Urine   UR  Result Value Ref Range   Urine Culture, Routine Final report    Organism ID, Bacteria Comment   Microscopic Examination   Urine  Result Value Ref Range   WBC, UA 0-5 0 - 5 /hpf   RBC 0-2 0 - 2 /hpf   Epithelial Cells (non renal) 0-10 0 - 10 /hpf   Mucus, UA Present (A) Not Estab.   Bacteria, UA Many (A) None seen/Few   Trichomonas, UA Present (A) None seen  CBC with Differential/Platelet  Result Value Ref Range   WBC 5.9 3.4 - 10.8 x10E3/uL   RBC 4.40 3.77 - 5.28 x10E6/uL   Hemoglobin 12.6 11.1 - 15.9 g/dL   Hematocrit 39.1 34.0 - 46.6 %   MCV 89 79 - 97 fL   MCH 28.6 26.6 - 33.0  pg   MCHC 32.2 31.5 - 35.7 g/dL   RDW 11.5 (L) 11.7 - 15.4 %   Platelets 255 150 - 450 x10E3/uL   Neutrophils 56 Not Estab. %   Lymphs 35 Not Estab. %   Monocytes 8 Not Estab. %   Eos 1 Not Estab. %   Basos 0 Not Estab. %   Neutrophils Absolute 3.4 1.4 - 7.0 x10E3/uL   Lymphocytes Absolute 2.1 0.7 - 3.1 x10E3/uL   Monocytes Absolute 0.5 0.1 - 0.9 x10E3/uL   EOS (ABSOLUTE) 0.1 0.0 - 0.4 x10E3/uL   Basophils Absolute 0.0 0.0 - 0.2 x10E3/uL   Immature Granulocytes 0 Not Estab. %   Immature Grans (Abs) 0.0 0.0 - 0.1 x10E3/uL  Comprehensive metabolic panel  Result Value Ref Range   Glucose 94 65 - 99 mg/dL   BUN 11 6 - 20 mg/dL   Creatinine, Ser 0.56 (L) 0.57 - 1.00 mg/dL   eGFR 122 >59 mL/min/1.73   BUN/Creatinine Ratio 20 9 - 23   Sodium 141 134 - 144 mmol/L   Potassium 4.6 3.5 - 5.2 mmol/L   Chloride 105 96 - 106 mmol/L   CO2 21 20 - 29 mmol/L   Calcium 9.7 8.7 - 10.2 mg/dL   Total Protein 5.9 (L) 6.0 - 8.5 g/dL   Albumin 4.0 3.8 - 4.8 g/dL   Globulin, Total 1.9 1.5 - 4.5 g/dL   Albumin/Globulin Ratio 2.1 1.2 - 2.2   Bilirubin Total 0.4 0.0 - 1.2 mg/dL   Alkaline Phosphatase 114 44 - 121 IU/L   AST 24 0 - 40 IU/L   ALT 15 0 - 32 IU/L  Lipid panel  Result Value Ref Range   Cholesterol, Total 106 100 - 199 mg/dL   Triglycerides 100 0 - 149 mg/dL   HDL 38 (L) >39 mg/dL   VLDL Cholesterol Cal 19 5 - 40 mg/dL   LDL Chol Calc (NIH) 49 0 - 99 mg/dL   Chol/HDL Ratio 2.8 0.0 - 4.4 ratio  TSH  Result Value Ref Range   TSH <0.005 (L) 0.450 - 4.500 uIU/mL  Urinalysis, Routine w reflex microscopic  Result Value Ref Range   Specific Gravity, UA 1.020 1.005 - 1.030   pH, UA 5.0 5.0 - 7.5   Color, UA Yellow Yellow   Appearance Ur Cloudy (A)  Clear   Leukocytes,UA Trace (A) Negative   Protein,UA Negative Negative/Trace   Glucose, UA Negative Negative   Ketones, UA Negative Negative   RBC, UA Negative Negative   Bilirubin, UA Negative Negative   Urobilinogen, Ur 0.2 0.2 - 1.0  mg/dL   Nitrite, UA Negative Negative   Microscopic Examination See below:   Hepatitis C Antibody  Result Value Ref Range   Hep C Virus Ab <0.1 0.0 - 0.9 s/co ratio  Pregnancy, urine  Result Value Ref Range   Preg Test, Ur Negative Negative      Assessment & Plan:   Problem List Items Addressed This Visit       Cardiovascular and Mediastinum   Hypertension 137/91 on 04/27/21 - Primary    Chronic.  Uncontrolled.  Will increase Amlodipine to 41m daily.  Return to clinic in 1 months for reevaluation.  Call sooner if concerns arise.        Relevant Medications   amLODipine (NORVASC) 10 MG tablet     Follow up plan: Return in about 1 month (around 01/17/2022) for BP Check.

## 2022-01-03 DIAGNOSIS — E0500 Thyrotoxicosis with diffuse goiter without thyrotoxic crisis or storm: Secondary | ICD-10-CM | POA: Diagnosis not present

## 2022-01-04 IMAGING — US US OB < 14 WEEKS - US OB TV
1 series · 13 of 28 positions shown · non-contrast
Comparison: None.

CLINICAL DATA: 34-year-old pregnant female with vaginal bleeding.
LMP: 01/25/2021 corresponding to an estimated gestational age of 7
weeks, 6 days.

EXAM:
OBSTETRIC <14 WK US AND TRANSVAGINAL OB US
TECHNIQUE: Both transabdominal and transvaginal ultrasound examinations were
performed for complete evaluation of the gestation as well as the
maternal uterus, adnexal regions, and pelvic cul-de-sac.
Transvaginal technique was performed to assess early pregnancy.

[Series 1: us ob transvaginal · 109 acquisitions, 13 frames shown]
[im 5/109]
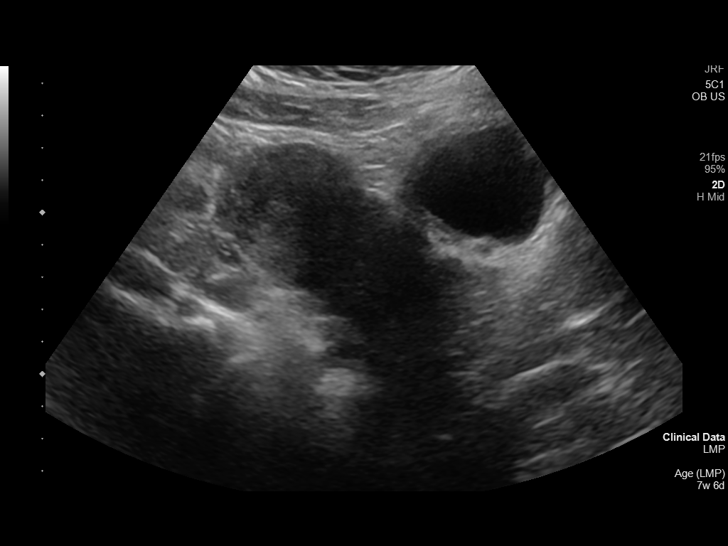
[im 13/109]
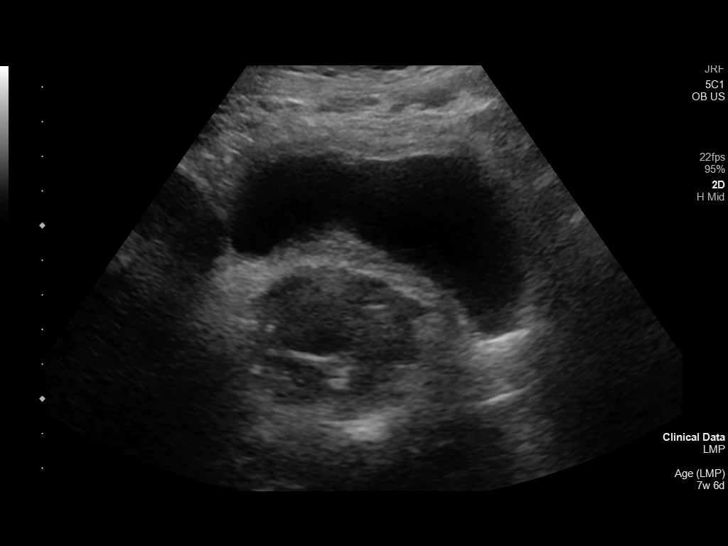
[im 21/109]
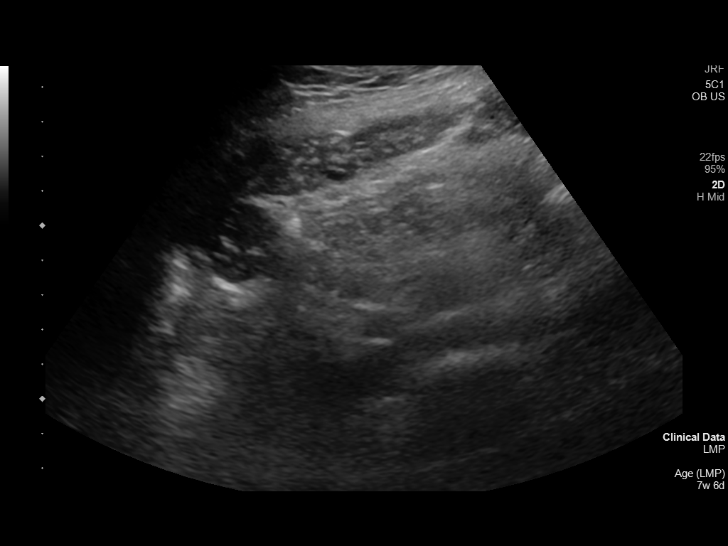
[im 29/109]
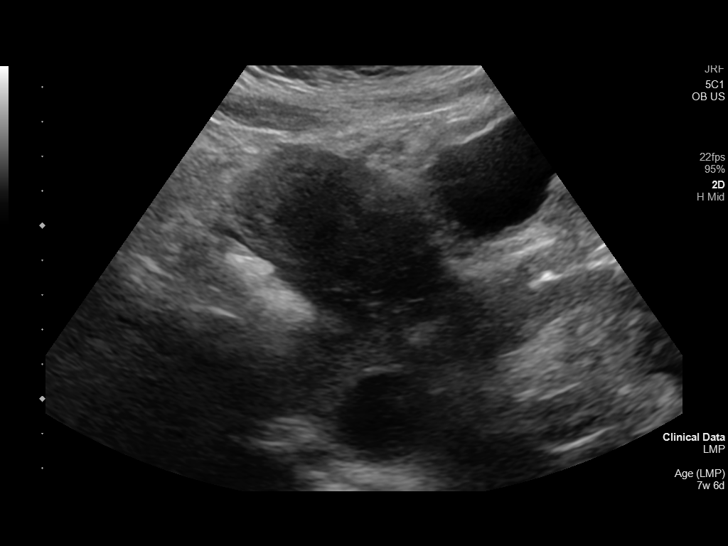
[im 37/109]
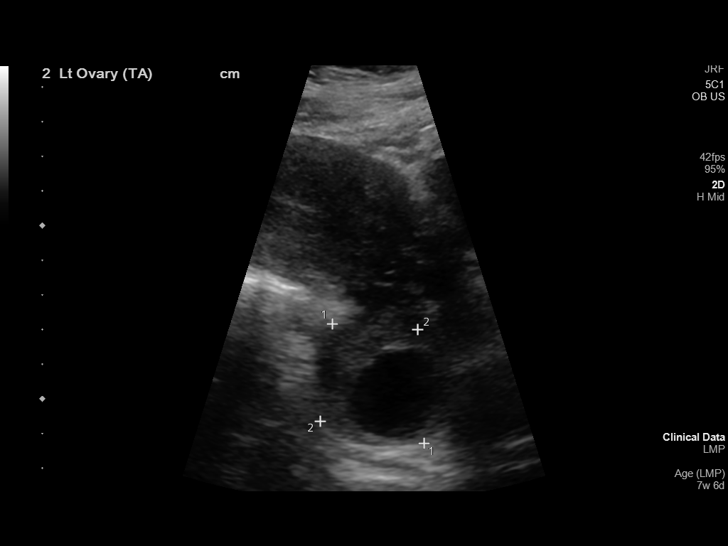
[im 45/109]
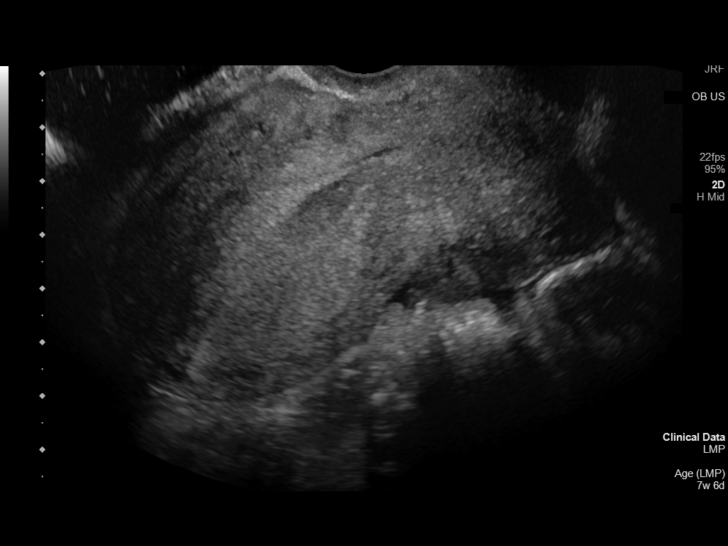
[im 57/109]
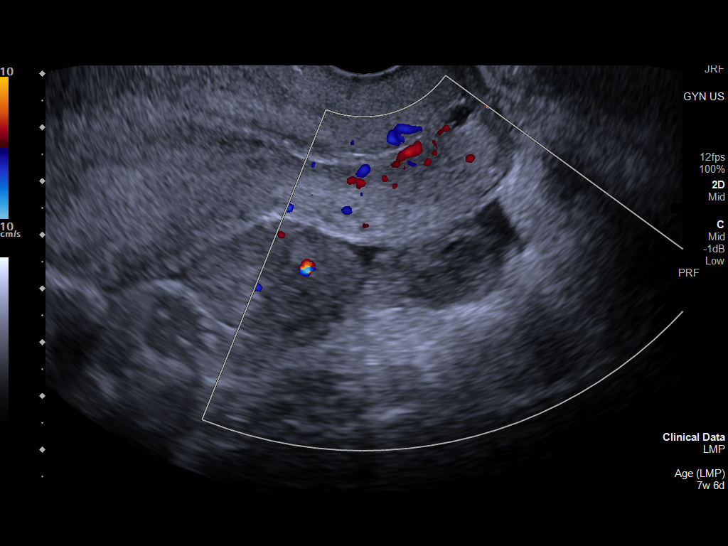
[im 65/109]
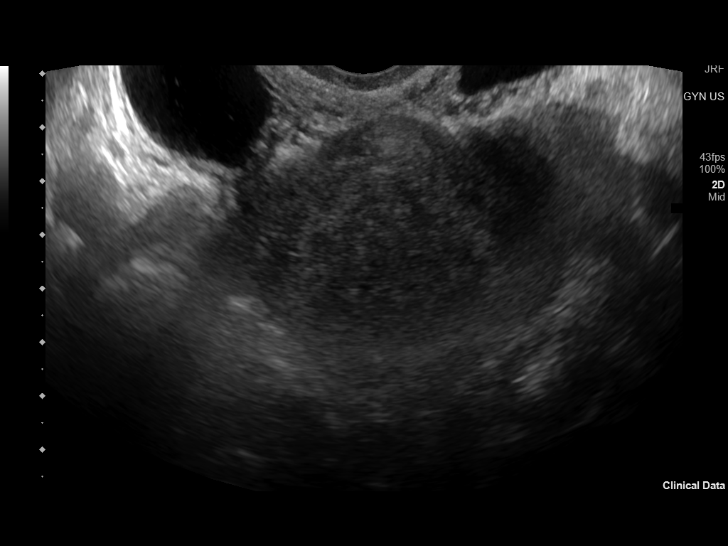
[im 73/109]
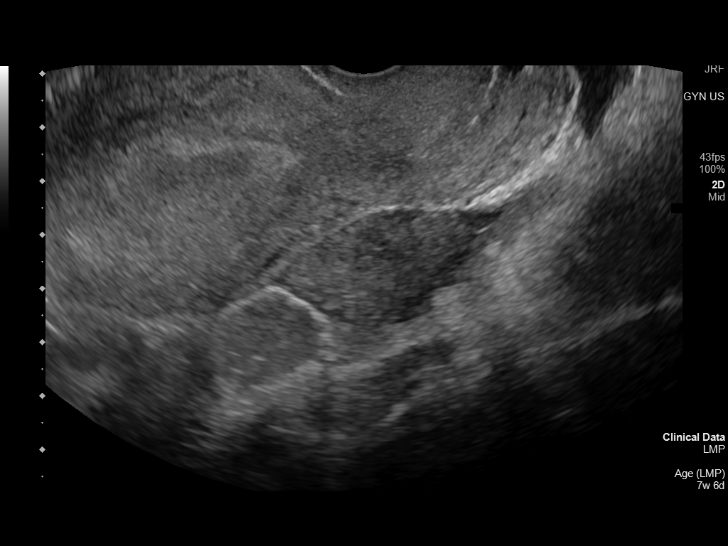
[im 81/109]
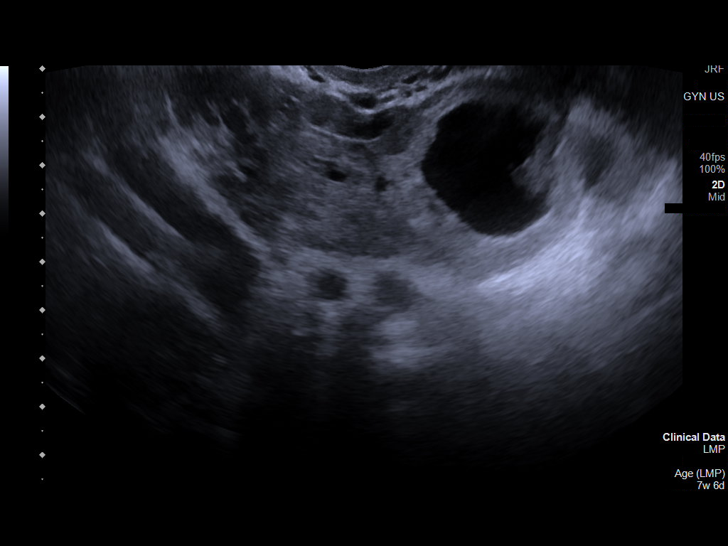
[im 89/109]
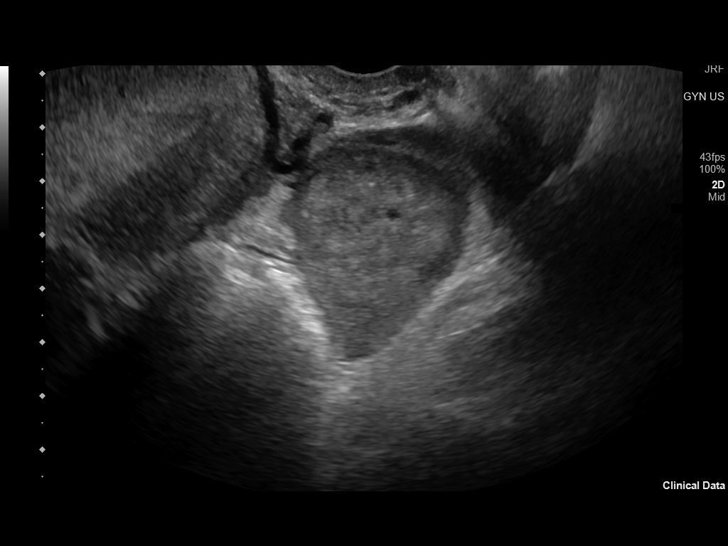
[im 97/109]
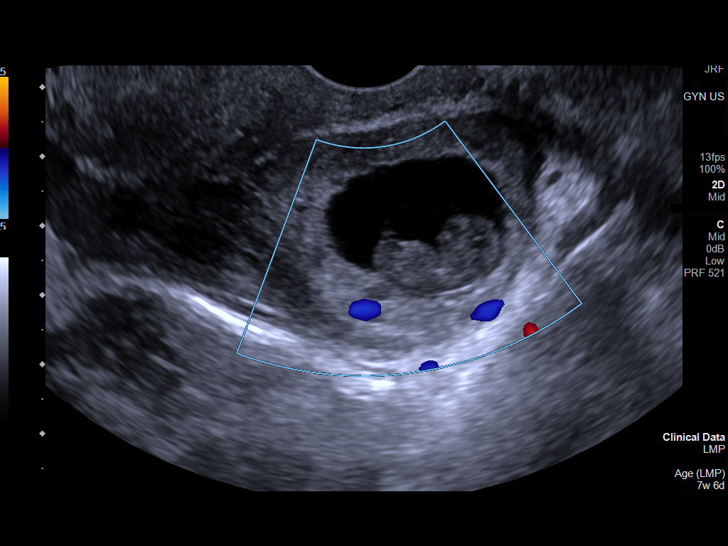
[im 105/109]
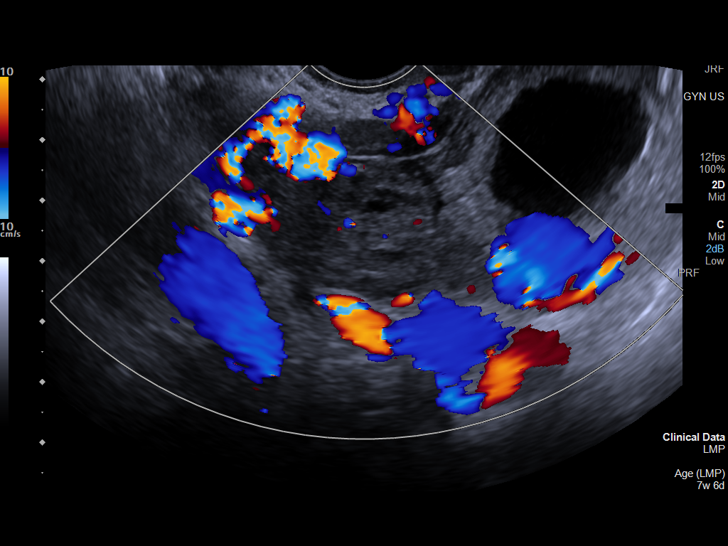

[13 of 28 positions shown; findings below may reference images not displayed]

FINDINGS: The uterus is anteverted. There is slight heterogeneity the uterus
with findings suggestive of adenomyosis.

The endometrium measures approximately 1 cm in thickness. Small
amount of complex fluid noted within the endometrium, likely blood
product.

The ovaries are unremarkable.

There is and ectopic pregnancy in the left adnexa adjacent to the
left ovary. The gestational sac of the ectopic pregnancy measures
2.9 x 3.2 x 2.5 cm with a mean sac diameter of 2.9 cm. A fetal pole
is noted within the ectopic pregnancy. No fetal cardiac activity
identified. The estimated gestational age based on crown-rump length
of 21 mm is 8 weeks, 5 days.

Small amount of blood product noted in the left adnexa and
cul-de-sac.
IMPRESSION: Left adnexal ectopic pregnancy as above. No fetal cardiac activity
identified. Obstetrical consult is advised.

These results were called by telephone at the time of interpretation
on 03/21/2021 at [DATE] to provider ARIANNY MISTRETTA , who verbally
acknowledged these results.

## 2022-01-17 ENCOUNTER — Encounter: Payer: Self-pay | Admitting: Nurse Practitioner

## 2022-01-17 ENCOUNTER — Other Ambulatory Visit: Payer: Self-pay

## 2022-01-17 ENCOUNTER — Ambulatory Visit (INDEPENDENT_AMBULATORY_CARE_PROVIDER_SITE_OTHER): Payer: Medicaid Other | Admitting: Nurse Practitioner

## 2022-01-17 VITALS — BP 146/100 | HR 74 | Temp 98.5°F | Wt 224.6 lb

## 2022-01-17 DIAGNOSIS — I159 Secondary hypertension, unspecified: Secondary | ICD-10-CM

## 2022-01-17 NOTE — Progress Notes (Signed)
? ?BP (!) 146/100 (BP Location: Right Arm, Cuff Size: Normal)   Pulse 74   Temp 98.5 ?F (36.9 ?C) (Oral)   Wt 224 lb 9.6 oz (101.9 kg)   LMP 12/18/2021 (Exact Date)   SpO2 98%   BMI 37.79 kg/m?   ? ?Subjective:  ? ? Patient ID: Jill [name], female    DOB: 02-23-1986, 36 y.o.   MRN: [id number] ? ?HPI: ?Jill [name] is a 36 y.o. female ? ?Chief Complaint  ?Patient presents with  ? Hypertension  ?  1 month f/up   ? ?HYPERTENSION ?Hypertension status: uncontrolled  ?Satisfied with current treatment? no ?Duration of hypertension: years ?BP monitoring frequency:  not checking ?BP range:  ?BP medication side effects:  no ?Medication compliance: excellent compliance ?Previous BP meds:amlodipine ?Aspirin: no ?Recurrent headaches: no ?Visual changes: no ?Palpitations: no ?Dyspnea: no ?Chest pain: no ?Lower extremity edema: no ?Dizzy/lightheaded: no ?Patient states she is only taking Amlodipine 46m daily.  States it made her feel funny.  Patient states she is drinking 4-5 bottles per day.  She took the medication for 2-3 days.  ? ? ?Relevant past medical, surgical, family and social history reviewed and updated as indicated. Interim medical history since our last visit reviewed. ?Allergies and medications reviewed and updated. ? ?Review of Systems  ?Eyes:  Negative for visual disturbance.  ?Respiratory:  Negative for cough, chest tightness and shortness of breath.   ?Cardiovascular:  Negative for chest pain, palpitations and leg swelling.  ?Neurological:  Negative for dizziness and headaches.  ? ?Per HPI unless specifically indicated above ? ?   ?Objective:  ?  ?BP (!) 146/100 (BP Location: Right Arm, Cuff Size: Normal)   Pulse 74   Temp 98.5 ?F (36.9 ?C) (Oral)   Wt 224 lb 9.6 oz (101.9 kg)   LMP 12/18/2021 (Exact Date)   SpO2 98%   BMI 37.79 kg/m?   ?Wt Readings from Last 3 Encounters:  ?01/17/22 224 lb 9.6 oz (101.9 kg)  ?12/20/21 220 lb (99.8 kg)  ?06/17/21 214 lb (97.1 kg)  ?  ?Physical Exam ?Vitals  and nursing note reviewed.  ?Constitutional:   ?   General: She is not in acute distress. ?   Appearance: Normal appearance. She is obese. She is not ill-appearing, toxic-appearing or diaphoretic.  ?HENT:  ?   Head: Normocephalic.  ?   Right Ear: External ear normal.  ?   Left Ear: External ear normal.  ?   Nose: Nose normal.  ?   Mouth/Throat:  ?   Mouth: Mucous membranes are moist.  ?   Pharynx: Oropharynx is clear.  ?Eyes:  ?   General:     ?   Right eye: No discharge.     ?   Left eye: No discharge.  ?   Extraocular Movements: Extraocular movements intact.  ?   Conjunctiva/sclera: Conjunctivae normal.  ?   Pupils: Pupils are equal, round, and reactive to light.  ?Cardiovascular:  ?   Rate and Rhythm: Normal rate and regular rhythm.  ?   Heart sounds: No murmur heard. ?Pulmonary:  ?   Effort: Pulmonary effort is normal. No respiratory distress.  ?   Breath sounds: Normal breath sounds. No wheezing or rales.  ?Musculoskeletal:  ?   Cervical back: Normal range of motion and neck supple.  ?Skin: ?   General: Skin is warm and dry.  ?   Capillary Refill: Capillary refill takes less than 2 seconds.  ?Neurological:  ?  General: No focal deficit present.  ?   Mental Status: She is alert and oriented to person, place, and time. Mental status is at baseline.  ?Psychiatric:     ?   Mood and Affect: Mood normal.     ?   Behavior: Behavior normal.     ?   Thought Content: Thought content normal.     ?   Judgment: Judgment normal.  ? ? ?Results for orders placed or performed in visit on 06/17/21  ?Urine Culture  ? Specimen: Urine  ? UR  ?Result Value Ref Range  ? Urine Culture, Routine Final report   ? Organism ID, Bacteria Comment   ?Microscopic Examination  ? Urine  ?Result Value Ref Range  ? WBC, UA 0-5 0 - 5 /hpf  ? RBC 0-2 0 - 2 /hpf  ? Epithelial Cells (non renal) 0-10 0 - 10 /hpf  ? Mucus, UA Present (A) Not Estab.  ? Bacteria, UA Many (A) None seen/Few  ? Trichomonas, UA Present (A) None seen  ?CBC with  Differential/Platelet  ?Result Value Ref Range  ? WBC 5.9 3.4 - 10.8 x10E3/uL  ? RBC 4.40 3.77 - 5.28 x10E6/uL  ? Hemoglobin 12.6 11.1 - 15.9 g/dL  ? Hematocrit 39.1 34.0 - 46.6 %  ? MCV 89 79 - 97 fL  ? MCH 28.6 26.6 - 33.0 pg  ? MCHC 32.2 31.5 - 35.7 g/dL  ? RDW 11.5 (L) 11.7 - 15.4 %  ? Platelets 255 150 - 450 x10E3/uL  ? Neutrophils 56 Not Estab. %  ? Lymphs 35 Not Estab. %  ? Monocytes 8 Not Estab. %  ? Eos 1 Not Estab. %  ? Basos 0 Not Estab. %  ? Neutrophils Absolute 3.4 1.4 - 7.0 x10E3/uL  ? Lymphocytes Absolute 2.1 0.7 - 3.1 x10E3/uL  ? Monocytes Absolute 0.5 0.1 - 0.9 x10E3/uL  ? EOS (ABSOLUTE) 0.1 0.0 - 0.4 x10E3/uL  ? Basophils Absolute 0.0 0.0 - 0.2 x10E3/uL  ? Immature Granulocytes 0 Not Estab. %  ? Immature Grans (Abs) 0.0 0.0 - 0.1 x10E3/uL  ?Comprehensive metabolic panel  ?Result Value Ref Range  ? Glucose 94 65 - 99 mg/dL  ? BUN 11 6 - 20 mg/dL  ? Creatinine, Ser 0.56 (L) 0.57 - 1.00 mg/dL  ? eGFR 122 >59 mL/min/1.73  ? BUN/Creatinine Ratio 20 9 - 23  ? Sodium 141 134 - 144 mmol/L  ? Potassium 4.6 3.5 - 5.2 mmol/L  ? Chloride 105 96 - 106 mmol/L  ? CO2 21 20 - 29 mmol/L  ? Calcium 9.7 8.7 - 10.2 mg/dL  ? Total Protein 5.9 (L) 6.0 - 8.5 g/dL  ? Albumin 4.0 3.8 - 4.8 g/dL  ? Globulin, Total 1.9 1.5 - 4.5 g/dL  ? Albumin/Globulin Ratio 2.1 1.2 - 2.2  ? Bilirubin Total 0.4 0.0 - 1.2 mg/dL  ? Alkaline Phosphatase 114 44 - 121 IU/L  ? AST 24 0 - 40 IU/L  ? ALT 15 0 - 32 IU/L  ?Lipid panel  ?Result Value Ref Range  ? Cholesterol, Total 106 100 - 199 mg/dL  ? Triglycerides 100 0 - 149 mg/dL  ? HDL 38 (L) >39 mg/dL  ? VLDL Cholesterol Cal 19 5 - 40 mg/dL  ? LDL Chol Calc (NIH) 49 0 - 99 mg/dL  ? Chol/HDL Ratio 2.8 0.0 - 4.4 ratio  ?TSH  ?Result Value Ref Range  ? TSH <0.005 (L) 0.450 - 4.500 uIU/mL  ?Urinalysis, Routine w reflex  microscopic  ?Result Value Ref Range  ? Specific Gravity, UA 1.020 1.005 - 1.030  ? pH, UA 5.0 5.0 - 7.5  ? Color, UA Yellow Yellow  ? Appearance Ur Cloudy (A) Clear  ?  Leukocytes,UA Trace (A) Negative  ? Protein,UA Negative Negative/Trace  ? Glucose, UA Negative Negative  ? Ketones, UA Negative Negative  ? RBC, UA Negative Negative  ? Bilirubin, UA Negative Negative  ? Urobilinogen, Ur 0.2 0.2 - 1.0 mg/dL  ? Nitrite, UA Negative Negative  ? Microscopic Examination See below:   ?Hepatitis C Antibody  ?Result Value Ref Range  ? Hep C Virus Ab <0.1 0.0 - 0.9 s/co ratio  ?Pregnancy, urine  ?Result Value Ref Range  ? Preg Test, Ur Negative Negative  ? ?   ?Assessment & Plan:  ? ?Problem List Items Addressed This Visit   ? ?  ? Cardiovascular and Mediastinum  ? Hypertension 137/91 on 04/27/21 - Primary  ?  Chronic. Not well controlled.  Did not increase medication Amlodipine 81m. Discussed increasing dose to 139m  Patient agrees.  Will follow up in 1 month for reevaluation.  ?  ?  ?  ? ?Follow up plan: ?Return in about 1 month (around 02/17/2022) for BP Check. ? ? ? ? ? ?

## 2022-01-18 NOTE — Assessment & Plan Note (Signed)
Chronic. Not well controlled.  Did not increase medication Amlodipine 10mg . Discussed increasing dose to 10mg .  Patient agrees.  Will follow up in 1 month for reevaluation.  ?

## 2022-03-14 NOTE — Progress Notes (Deleted)
There were no vitals taken for this visit.   Subjective:    Patient ID: Jill [name], female    DOB: August 07, 1986, 36 y.o.   MRN: [id number]  HPI: Jill [name] is a 36 y.o. female  No chief complaint on file.  HYPERTENSION Hypertension status: {Blank single:19197::"controlled","uncontrolled","better","worse","exacerbated","stable"}  Satisfied with current treatment? {Blank single:19197::"yes","no"} Duration of hypertension: {Blank single:19197::"chronic","months","years"} BP monitoring frequency:  {Blank single:19197::"not checking","rarely","daily","weekly","monthly","a few times a day","a few times a week","a few times a month"} BP range:  BP medication side effects:  {Blank single:19197::"yes","no"} Medication compliance: {Blank single:19197::"excellent compliance","good compliance","fair compliance","poor compliance"} Previous BP meds:{Blank VQQVZDGL:87564::"PPIR","JJOACZYSAY","TKZSWFUXNA/TFTDDUKGUR","KYHCWCBJ","SEGBTDVVOH","YWVPXTGGYI/RSWN","IOEVOJJKKX (bystolic)","carvedilol","chlorthalidone","clonidine","diltiazem","exforge HCT","HCTZ","irbesartan (avapro)","labetalol","lisinopril","lisinopril-HCTZ","losartan (cozaar)","methyldopa","nifedipine","olmesartan (benicar)","olmesartan-HCTZ","quinapril","ramipril","spironalactone","tekturna","valsartan","valsartan-HCTZ","verapamil"} Aspirin: {Blank single:19197::"yes","no"} Recurrent headaches: {Blank single:19197::"yes","no"} Visual changes: {Blank single:19197::"yes","no"} Palpitations: {Blank single:19197::"yes","no"} Dyspnea: {Blank single:19197::"yes","no"} Chest pain: {Blank single:19197::"yes","no"} Lower extremity edema: {Blank single:19197::"yes","no"} Dizzy/lightheaded: {Blank single:19197::"yes","no"}  Relevant past medical, surgical, family and social history reviewed and updated as indicated. Interim medical history since our last visit reviewed. Allergies and medications reviewed and updated.  Review of  Systems  Per HPI unless specifically indicated above     Objective:    There were no vitals taken for this visit.  Wt Readings from Last 3 Encounters:  01/17/22 224 lb 9.6 oz (101.9 kg)  12/20/21 220 lb (99.8 kg)  06/17/21 214 lb (97.1 kg)    Physical Exam  Results for orders placed or performed in visit on 06/17/21  Urine Culture   Specimen: Urine   UR  Result Value Ref Range   Urine Culture, Routine Final report    Organism ID, Bacteria Comment   Microscopic Examination   Urine  Result Value Ref Range   WBC, UA 0-5 0 - 5 /hpf   RBC 0-2 0 - 2 /hpf   Epithelial Cells (non renal) 0-10 0 - 10 /hpf   Mucus, UA Present (A) Not Estab.   Bacteria, UA Many (A) None seen/Few   Trichomonas, UA Present (A) None seen  CBC with Differential/Platelet  Result Value Ref Range   WBC 5.9 3.4 - 10.8 x10E3/uL   RBC 4.40 3.77 - 5.28 x10E6/uL   Hemoglobin 12.6 11.1 - 15.9 g/dL   Hematocrit 39.1 34.0 - 46.6 %   MCV 89 79 - 97 fL   MCH 28.6 26.6 - 33.0 pg   MCHC 32.2 31.5 - 35.7 g/dL   RDW 11.5 (L) 11.7 - 15.4 %   Platelets 255 150 - 450 x10E3/uL   Neutrophils 56 Not Estab. %   Lymphs 35 Not Estab. %   Monocytes 8 Not Estab. %   Eos 1 Not Estab. %   Basos 0 Not Estab. %   Neutrophils Absolute 3.4 1.4 - 7.0 x10E3/uL   Lymphocytes Absolute 2.1 0.7 - 3.1 x10E3/uL   Monocytes Absolute 0.5 0.1 - 0.9 x10E3/uL   EOS (ABSOLUTE) 0.1 0.0 - 0.4 x10E3/uL   Basophils Absolute 0.0 0.0 - 0.2 x10E3/uL   Immature Granulocytes 0 Not Estab. %   Immature Grans (Abs) 0.0 0.0 - 0.1 x10E3/uL  Comprehensive metabolic panel  Result Value Ref Range   Glucose 94 65 - 99 mg/dL   BUN 11 6 - 20 mg/dL   Creatinine, Ser 0.56 (L) 0.57 - 1.00 mg/dL   eGFR 122 >59 mL/min/1.73   BUN/Creatinine Ratio 20 9 - 23   Sodium 141 134 - 144 mmol/L   Potassium 4.6 3.5 - 5.2 mmol/L   Chloride 105 96 - 106 mmol/L   CO2 21 20 - 29 mmol/L   Calcium 9.7 8.7 - 10.2 mg/dL  Total Protein 5.9 (L) 6.0 - 8.5 g/dL   Albumin 4.0  3.8 - 4.8 g/dL   Globulin, Total 1.9 1.5 - 4.5 g/dL   Albumin/Globulin Ratio 2.1 1.2 - 2.2   Bilirubin Total 0.4 0.0 - 1.2 mg/dL   Alkaline Phosphatase 114 44 - 121 IU/L   AST 24 0 - 40 IU/L   ALT 15 0 - 32 IU/L  Lipid panel  Result Value Ref Range   Cholesterol, Total 106 100 - 199 mg/dL   Triglycerides 100 0 - 149 mg/dL   HDL 38 (L) >39 mg/dL   VLDL Cholesterol Cal 19 5 - 40 mg/dL   LDL Chol Calc (NIH) 49 0 - 99 mg/dL   Chol/HDL Ratio 2.8 0.0 - 4.4 ratio  TSH  Result Value Ref Range   TSH <0.005 (L) 0.450 - 4.500 uIU/mL  Urinalysis, Routine w reflex microscopic  Result Value Ref Range   Specific Gravity, UA 1.020 1.005 - 1.030   pH, UA 5.0 5.0 - 7.5   Color, UA Yellow Yellow   Appearance Ur Cloudy (A) Clear   Leukocytes,UA Trace (A) Negative   Protein,UA Negative Negative/Trace   Glucose, UA Negative Negative   Ketones, UA Negative Negative   RBC, UA Negative Negative   Bilirubin, UA Negative Negative   Urobilinogen, Ur 0.2 0.2 - 1.0 mg/dL   Nitrite, UA Negative Negative   Microscopic Examination See below:   Hepatitis C Antibody  Result Value Ref Range   Hep C Virus Ab <0.1 0.0 - 0.9 s/co ratio  Pregnancy, urine  Result Value Ref Range   Preg Test, Ur Negative Negative      Assessment & Plan:   Problem List Items Addressed This Visit   None    Follow up plan: No follow-ups on file.

## 2022-03-15 ENCOUNTER — Ambulatory Visit: Payer: Medicaid Other | Admitting: Nurse Practitioner

## 2022-03-23 ENCOUNTER — Ambulatory Visit (INDEPENDENT_AMBULATORY_CARE_PROVIDER_SITE_OTHER): Payer: Medicaid Other | Admitting: Nurse Practitioner

## 2022-03-23 ENCOUNTER — Encounter: Payer: Self-pay | Admitting: Nurse Practitioner

## 2022-03-23 VITALS — BP 120/96 | HR 84 | Temp 98.3°F | Wt 229.8 lb

## 2022-03-23 DIAGNOSIS — I159 Secondary hypertension, unspecified: Secondary | ICD-10-CM

## 2022-03-23 MED ORDER — VALSARTAN 80 MG PO TABS: 80.0000 mg | ORAL_TABLET | Freq: Every day | ORAL | 0 refills | Status: DC

## 2022-03-23 NOTE — Assessment & Plan Note (Signed)
Chronic.  Not well controlled. Continue with current medication regimen of Amlodipine.  Add valsartan 80mg  daily.  Return to clinic in 1 months for reevaluation.  Call sooner if concerns arise.

## 2022-03-23 NOTE — Progress Notes (Signed)
BP (!) 120/96   Pulse 84   Temp 98.3 F (36.8 C) (Oral)   Wt 229 lb 12.8 oz (104.2 kg)   LMP 03/09/2022   SpO2 99%   Breastfeeding No   BMI 38.66 kg/m    Subjective:    Patient ID: Jill [name], female    DOB: 16-Jan-1986, 36 y.o.   MRN: [id number]  HPI: Jill [name] is a 36 y.o. female  Chief Complaint  Patient presents with   Hypertension   HYPERTENSION Hypertension status: better  Satisfied with current treatment? no Duration of hypertension: years BP monitoring frequency:  not checking BP range:  BP medication side effects:  no Medication compliance: excellent compliance Previous BP meds:amlodipine Aspirin: no Recurrent headaches: no Visual changes: no Palpitations: no Dyspnea: no Chest pain: no Lower extremity edema: no Dizzy/lightheaded: no  Relevant past medical, surgical, family and social history reviewed and updated as indicated. Interim medical history since our last visit reviewed. Allergies and medications reviewed and updated.  Review of Systems  Eyes:  Negative for visual disturbance.  Respiratory:  Negative for cough, chest tightness and shortness of breath.   Cardiovascular:  Negative for chest pain, palpitations and leg swelling.  Neurological:  Negative for dizziness and headaches.   Per HPI unless specifically indicated above     Objective:    BP (!) 120/96   Pulse 84   Temp 98.3 F (36.8 C) (Oral)   Wt 229 lb 12.8 oz (104.2 kg)   LMP 03/09/2022   SpO2 99%   Breastfeeding No   BMI 38.66 kg/m   Wt Readings from Last 3 Encounters:  03/23/22 229 lb 12.8 oz (104.2 kg)  01/17/22 224 lb 9.6 oz (101.9 kg)  12/20/21 220 lb (99.8 kg)    Physical Exam Vitals and nursing note reviewed.  Constitutional:      General: She is not in acute distress.    Appearance: Normal appearance. She is obese. She is not ill-appearing, toxic-appearing or diaphoretic.  HENT:     Head: Normocephalic.     Right Ear: External ear normal.      Left Ear: External ear normal.     Nose: Nose normal.     Mouth/Throat:     Mouth: Mucous membranes are moist.     Pharynx: Oropharynx is clear.  Eyes:     General:        Right eye: No discharge.        Left eye: No discharge.     Extraocular Movements: Extraocular movements intact.     Conjunctiva/sclera: Conjunctivae normal.     Pupils: Pupils are equal, round, and reactive to light.  Cardiovascular:     Rate and Rhythm: Normal rate and regular rhythm.     Heart sounds: No murmur heard. Pulmonary:     Effort: Pulmonary effort is normal. No respiratory distress.     Breath sounds: Normal breath sounds. No wheezing or rales.  Musculoskeletal:     Cervical back: Normal range of motion and neck supple.  Skin:    General: Skin is warm and dry.     Capillary Refill: Capillary refill takes less than 2 seconds.  Neurological:     General: No focal deficit present.     Mental Status: She is alert and oriented to person, place, and time. Mental status is at baseline.  Psychiatric:        Mood and Affect: Mood normal.        Behavior: Behavior normal.  Thought Content: Thought content normal.        Judgment: Judgment normal.    Results for orders placed or performed in visit on 06/17/21  Urine Culture   Specimen: Urine   UR  Result Value Ref Range   Urine Culture, Routine Final report    Organism ID, Bacteria Comment   Microscopic Examination   Urine  Result Value Ref Range   WBC, UA 0-5 0 - 5 /hpf   RBC 0-2 0 - 2 /hpf   Epithelial Cells (non renal) 0-10 0 - 10 /hpf   Mucus, UA Present (A) Not Estab.   Bacteria, UA Many (A) None seen/Few   Trichomonas, UA Present (A) None seen  CBC with Differential/Platelet  Result Value Ref Range   WBC 5.9 3.4 - 10.8 x10E3/uL   RBC 4.40 3.77 - 5.28 x10E6/uL   Hemoglobin 12.6 11.1 - 15.9 g/dL   Hematocrit 39.1 34.0 - 46.6 %   MCV 89 79 - 97 fL   MCH 28.6 26.6 - 33.0 pg   MCHC 32.2 31.5 - 35.7 g/dL   RDW 11.5 (L) 11.7 - 15.4 %    Platelets 255 150 - 450 x10E3/uL   Neutrophils 56 Not Estab. %   Lymphs 35 Not Estab. %   Monocytes 8 Not Estab. %   Eos 1 Not Estab. %   Basos 0 Not Estab. %   Neutrophils Absolute 3.4 1.4 - 7.0 x10E3/uL   Lymphocytes Absolute 2.1 0.7 - 3.1 x10E3/uL   Monocytes Absolute 0.5 0.1 - 0.9 x10E3/uL   EOS (ABSOLUTE) 0.1 0.0 - 0.4 x10E3/uL   Basophils Absolute 0.0 0.0 - 0.2 x10E3/uL   Immature Granulocytes 0 Not Estab. %   Immature Grans (Abs) 0.0 0.0 - 0.1 x10E3/uL  Comprehensive metabolic panel  Result Value Ref Range   Glucose 94 65 - 99 mg/dL   BUN 11 6 - 20 mg/dL   Creatinine, Ser 0.56 (L) 0.57 - 1.00 mg/dL   eGFR 122 >59 mL/min/1.73   BUN/Creatinine Ratio 20 9 - 23   Sodium 141 134 - 144 mmol/L   Potassium 4.6 3.5 - 5.2 mmol/L   Chloride 105 96 - 106 mmol/L   CO2 21 20 - 29 mmol/L   Calcium 9.7 8.7 - 10.2 mg/dL   Total Protein 5.9 (L) 6.0 - 8.5 g/dL   Albumin 4.0 3.8 - 4.8 g/dL   Globulin, Total 1.9 1.5 - 4.5 g/dL   Albumin/Globulin Ratio 2.1 1.2 - 2.2   Bilirubin Total 0.4 0.0 - 1.2 mg/dL   Alkaline Phosphatase 114 44 - 121 IU/L   AST 24 0 - 40 IU/L   ALT 15 0 - 32 IU/L  Lipid panel  Result Value Ref Range   Cholesterol, Total 106 100 - 199 mg/dL   Triglycerides 100 0 - 149 mg/dL   HDL 38 (L) >39 mg/dL   VLDL Cholesterol Cal 19 5 - 40 mg/dL   LDL Chol Calc (NIH) 49 0 - 99 mg/dL   Chol/HDL Ratio 2.8 0.0 - 4.4 ratio  TSH  Result Value Ref Range   TSH <0.005 (L) 0.450 - 4.500 uIU/mL  Urinalysis, Routine w reflex microscopic  Result Value Ref Range   Specific Gravity, UA 1.020 1.005 - 1.030   pH, UA 5.0 5.0 - 7.5   Color, UA Yellow Yellow   Appearance Ur Cloudy (A) Clear   Leukocytes,UA Trace (A) Negative   Protein,UA Negative Negative/Trace   Glucose, UA Negative Negative   Ketones, UA  Negative Negative   RBC, UA Negative Negative   Bilirubin, UA Negative Negative   Urobilinogen, Ur 0.2 0.2 - 1.0 mg/dL   Nitrite, UA Negative Negative   Microscopic Examination  See below:   Hepatitis C Antibody  Result Value Ref Range   Hep C Virus Ab <0.1 0.0 - 0.9 s/co ratio  Pregnancy, urine  Result Value Ref Range   Preg Test, Ur Negative Negative      Assessment & Plan:   Problem List Items Addressed This Visit       Cardiovascular and Mediastinum   Hypertension 137/91 on 04/27/21 - Primary    Chronic.  Not well controlled. Continue with current medication regimen of Amlodipine.  Add valsartan 18m daily.  Return to clinic in 1 months for reevaluation.  Call sooner if concerns arise.         Relevant Medications   valsartan (DIOVAN) 80 MG tablet     Follow up plan: Return in about 1 month (around 04/22/2022) for BP Check.

## 2022-04-21 NOTE — Progress Notes (Unsigned)
There were no vitals taken for this visit.   Subjective:    Patient ID: Jill [name], female    DOB: [birth year], 36 y.o.   MRN: [id number]  HPI: Jill [name] is a 36 y.o. female  No chief complaint on file.  HYPERTENSION Hypertension status: {Blank single:19197::"controlled","uncontrolled","better","worse","exacerbated","stable"}  Satisfied with current treatment? {Blank single:19197::"yes","no"} Duration of hypertension: {Blank single:19197::"chronic","months","years"} BP monitoring frequency:  {Blank single:19197::"not checking","rarely","daily","weekly","monthly","a few times a day","a few times a week","a few times a month"} BP range:  BP medication side effects:  {Blank single:19197::"yes","no"} Medication compliance: {Blank single:19197::"excellent compliance","good compliance","fair compliance","poor compliance"} Previous BP meds:{Blank OIBBCWUG:89169::"IHWT","UUEKCMKLKJ","ZPHXTAVWPV/XYIAXKPVVZ","SMOLMBEM","LJQGBEEFEO","FHQRFXJOIT/GPQD","IYMEBRAXEN (bystolic)","carvedilol","chlorthalidone","clonidine","diltiazem","exforge HCT","HCTZ","irbesartan (avapro)","labetalol","lisinopril","lisinopril-HCTZ","losartan (cozaar)","methyldopa","nifedipine","olmesartan (benicar)","olmesartan-HCTZ","quinapril","ramipril","spironalactone","tekturna","valsartan","valsartan-HCTZ","verapamil"} Aspirin: {Blank single:19197::"yes","no"} Recurrent headaches: {Blank single:19197::"yes","no"} Visual changes: {Blank single:19197::"yes","no"} Palpitations: {Blank single:19197::"yes","no"} Dyspnea: {Blank single:19197::"yes","no"} Chest pain: {Blank single:19197::"yes","no"} Lower extremity edema: {Blank single:19197::"yes","no"} Dizzy/lightheaded: {Blank single:19197::"yes","no"}  Relevant past medical, surgical, family and social history reviewed and updated as indicated. Interim medical history since our last visit reviewed. Allergies and medications reviewed and updated.  Review of  Systems  Per HPI unless specifically indicated above     Objective:    There were no vitals taken for this visit.  Wt Readings from Last 3 Encounters:  03/23/22 229 lb 12.8 oz (104.2 kg)  01/17/22 224 lb 9.6 oz (101.9 kg)  12/20/21 220 lb (99.8 kg)    Physical Exam  Results for orders placed or performed in visit on 06/17/21  Urine Culture   Specimen: Urine   UR  Result Value Ref Range   Urine Culture, Routine Final report    Organism ID, Bacteria Comment   Microscopic Examination   Urine  Result Value Ref Range   WBC, UA 0-5 0 - 5 /hpf   RBC, Urine 0-2 0 - 2 /hpf   Epithelial Cells (non renal) 0-10 0 - 10 /hpf   Mucus, UA Present (A) Not Estab.   Bacteria, UA Many (A) None seen/Few   Trichomonas, UA Present (A) None seen  CBC with Differential/Platelet  Result Value Ref Range   WBC 5.9 3.4 - 10.8 x10E3/uL   RBC 4.40 3.77 - 5.28 x10E6/uL   Hemoglobin 12.6 11.1 - 15.9 g/dL   Hematocrit 39.1 34.0 - 46.6 %   MCV 89 79 - 97 fL   MCH 28.6 26.6 - 33.0 pg   MCHC 32.2 31.5 - 35.7 g/dL   RDW 11.5 (L) 11.7 - 15.4 %   Platelets 255 150 - 450 x10E3/uL   Neutrophils 56 Not Estab. %   Lymphs 35 Not Estab. %   Monocytes 8 Not Estab. %   Eos 1 Not Estab. %   Basos 0 Not Estab. %   Neutrophils Absolute 3.4 1.4 - 7.0 x10E3/uL   Lymphocytes Absolute 2.1 0.7 - 3.1 x10E3/uL   Monocytes Absolute 0.5 0.1 - 0.9 x10E3/uL   EOS (ABSOLUTE) 0.1 0.0 - 0.4 x10E3/uL   Basophils Absolute 0.0 0.0 - 0.2 x10E3/uL   Immature Granulocytes 0 Not Estab. %   Immature Grans (Abs) 0.0 0.0 - 0.1 x10E3/uL  Comprehensive metabolic panel  Result Value Ref Range   Glucose 94 65 - 99 mg/dL   BUN 11 6 - 20 mg/dL   Creatinine, Ser 0.56 (L) 0.57 - 1.00 mg/dL   eGFR 122 >59 mL/min/1.73   BUN/Creatinine Ratio 20 9 - 23   Sodium 141 134 - 144 mmol/L   Potassium 4.6 3.5 - 5.2 mmol/L   Chloride 105 96 - 106 mmol/L   CO2 21 20 - 29 mmol/L   Calcium 9.7 8.7 -  10.2 mg/dL   Total Protein 5.9 (L) 6.0 - 8.5 g/dL    Albumin 4.0 3.8 - 4.8 g/dL   Globulin, Total 1.9 1.5 - 4.5 g/dL   Albumin/Globulin Ratio 2.1 1.2 - 2.2   Bilirubin Total 0.4 0.0 - 1.2 mg/dL   Alkaline Phosphatase 114 44 - 121 IU/L   AST 24 0 - 40 IU/L   ALT 15 0 - 32 IU/L  Lipid panel  Result Value Ref Range   Cholesterol, Total 106 100 - 199 mg/dL   Triglycerides 100 0 - 149 mg/dL   HDL 38 (L) >39 mg/dL   VLDL Cholesterol Cal 19 5 - 40 mg/dL   LDL Chol Calc (NIH) 49 0 - 99 mg/dL   Chol/HDL Ratio 2.8 0.0 - 4.4 ratio  TSH  Result Value Ref Range   TSH <0.005 (L) 0.450 - 4.500 uIU/mL  Urinalysis, Routine w reflex microscopic  Result Value Ref Range   Specific Gravity, UA 1.020 1.005 - 1.030   pH, UA 5.0 5.0 - 7.5   Color, UA Yellow Yellow   Appearance Ur Cloudy (A) Clear   Leukocytes,UA Trace (A) Negative   Protein,UA Negative Negative/Trace   Glucose, UA Negative Negative   Ketones, UA Negative Negative   RBC, UA Negative Negative   Bilirubin, UA Negative Negative   Urobilinogen, Ur 0.2 0.2 - 1.0 mg/dL   Nitrite, UA Negative Negative   Microscopic Examination See below:   Hepatitis C Antibody  Result Value Ref Range   Hep C Virus Ab <0.1 0.0 - 0.9 s/co ratio  Pregnancy, urine  Result Value Ref Range   Preg Test, Ur Negative Negative      Assessment & Plan:   Problem List Items Addressed This Visit       Cardiovascular and Mediastinum   Hypertension 137/91 on 04/27/21 - Primary     Follow up plan: No follow-ups on file.

## 2022-04-24 ENCOUNTER — Encounter: Payer: Self-pay | Admitting: Nurse Practitioner

## 2022-04-24 ENCOUNTER — Ambulatory Visit (INDEPENDENT_AMBULATORY_CARE_PROVIDER_SITE_OTHER): Payer: Medicaid Other | Admitting: Nurse Practitioner

## 2022-04-24 VITALS — BP 128/91 | HR 68 | Temp 98.7°F | Wt 227.0 lb

## 2022-04-24 DIAGNOSIS — I159 Secondary hypertension, unspecified: Secondary | ICD-10-CM | POA: Diagnosis not present

## 2022-04-24 DIAGNOSIS — E0590 Thyrotoxicosis, unspecified without thyrotoxic crisis or storm: Secondary | ICD-10-CM

## 2022-04-24 MED ORDER — VALSARTAN 160 MG PO TABS: 160.0000 mg | ORAL_TABLET | Freq: Every day | ORAL | 0 refills | Status: DC

## 2022-04-24 NOTE — Assessment & Plan Note (Signed)
Chronic. Not well controlled. Will increase dose of Valsartan from 80mg  to 160mg  daily.  Continue with Amlodipine.  Follow up in 1 month for reevaluation.  Call sooner if concerns arise.

## 2022-05-02 ENCOUNTER — Other Ambulatory Visit: Payer: Self-pay | Admitting: Nurse Practitioner

## 2022-05-03 NOTE — Telephone Encounter (Signed)
Requested medication (s) are due for refill today: no  Requested medication (s) are on the active medication list:yes  Last refill:  04/24/22  Future visit scheduled: yes  Notes to clinic:  Unable to refill per protocol, last refill by provider on 04/24/22 for 30 days and 0 refills. Possible duplicate request.     Requested Prescriptions  Pending Prescriptions Disp Refills   valsartan (DIOVAN) 80 MG tablet [Pharmacy Med Name: VALSARTAN 80MG  TABLETS] 30 tablet 0    Sig: TAKE 1 TABLET(80 MG) BY MOUTH DAILY     Cardiovascular:  Angiotensin Receptor Blockers Failed - 05/02/2022  8:06 PM      Failed - Cr in normal range and within 180 days    Creatinine, Ser  Date Value Ref Range Status  06/17/2021 0.56 (L) 0.57 - 1.00 mg/dL Final         Failed - K in normal range and within 180 days    Potassium  Date Value Ref Range Status  06/17/2021 4.6 3.5 - 5.2 mmol/L Final         Failed - Last BP in normal range    BP Readings from Last 1 Encounters:  04/24/22 (!) 128/91         Passed - Patient is not pregnant      Passed - Valid encounter within last 6 months    Recent Outpatient Visits           1 week ago Secondary hypertension   [facility], NP   1 month ago Secondary hypertension   [facility], NP   3 months ago Secondary hypertension   [facility] NP   4 months ago Secondary hypertension   [facility], NP   10 months ago Annual physical exam   [facility], NP       Future Appointments             In 3 weeks Larae Grooms, NP [facility]

## 2022-05-24 NOTE — Progress Notes (Unsigned)
There were no vitals taken for this visit.   Subjective:    Patient ID: Jill [name], female    DOB: 1986-05-23, 36 y.o.   MRN: [id number]  HPI: Jill [name] is a 36 y.o. female  No chief complaint on file.  HYPERTENSION Hypertension status: controlled  Satisfied with current treatment? no Duration of hypertension: years BP monitoring frequency:  not checking BP range:  BP medication side effects:  no Medication compliance: excellent compliance Previous BP meds:amlodipine and valsartan Aspirin: no Recurrent headaches: no Visual changes: no Palpitations: no Dyspnea: no Chest pain: no Lower extremity edema: no Dizzy/lightheaded: no  Relevant past medical, surgical, family and social history reviewed and updated as indicated. Interim medical history since our last visit reviewed. Allergies and medications reviewed and updated.  Review of Systems  Eyes:  Negative for visual disturbance.  Respiratory:  Negative for cough, chest tightness and shortness of breath.   Cardiovascular:  Negative for chest pain, palpitations and leg swelling.  Neurological:  Negative for dizziness and headaches.    Per HPI unless specifically indicated above     Objective:    There were no vitals taken for this visit.  Wt Readings from Last 3 Encounters:  04/24/22 227 lb (103 kg)  03/23/22 229 lb 12.8 oz (104.2 kg)  01/17/22 224 lb 9.6 oz (101.9 kg)    Physical Exam Vitals and nursing note reviewed.  Constitutional:      General: She is not in acute distress.    Appearance: Normal appearance. She is obese. She is not ill-appearing, toxic-appearing or diaphoretic.  HENT:     Head: Normocephalic.     Right Ear: External ear normal.     Left Ear: External ear normal.     Nose: Nose normal.     Mouth/Throat:     Mouth: Mucous membranes are moist.     Pharynx: Oropharynx is clear.  Eyes:     General:        Right eye: No discharge.        Left eye: No discharge.      Extraocular Movements: Extraocular movements intact.     Conjunctiva/sclera: Conjunctivae normal.     Pupils: Pupils are equal, round, and reactive to light.  Cardiovascular:     Rate and Rhythm: Normal rate and regular rhythm.     Heart sounds: No murmur heard. Pulmonary:     Effort: Pulmonary effort is normal. No respiratory distress.     Breath sounds: Normal breath sounds. No wheezing or rales.  Musculoskeletal:     Cervical back: Normal range of motion and neck supple.  Skin:    General: Skin is warm and dry.     Capillary Refill: Capillary refill takes less than 2 seconds.  Neurological:     General: No focal deficit present.     Mental Status: She is alert and oriented to person, place, and time. Mental status is at baseline.  Psychiatric:        Mood and Affect: Mood normal.        Behavior: Behavior normal.        Thought Content: Thought content normal.        Judgment: Judgment normal.    Results for orders placed or performed in visit on 06/17/21  Urine Culture   Specimen: Urine   UR  Result Value Ref Range   Urine Culture, Routine Final report    Organism ID, Bacteria Comment   Microscopic Examination   Urine  Result Value Ref Range   WBC, UA 0-5 0 - 5 /hpf   RBC, Urine 0-2 0 - 2 /hpf   Epithelial Cells (non renal) 0-10 0 - 10 /hpf   Mucus, UA Present (A) Not Estab.   Bacteria, UA Many (A) None seen/Few   Trichomonas, UA Present (A) None seen  CBC with Differential/Platelet  Result Value Ref Range   WBC 5.9 3.4 - 10.8 x10E3/uL   RBC 4.40 3.77 - 5.28 x10E6/uL   Hemoglobin 12.6 11.1 - 15.9 g/dL   Hematocrit 39.1 34.0 - 46.6 %   MCV 89 79 - 97 fL   MCH 28.6 26.6 - 33.0 pg   MCHC 32.2 31.5 - 35.7 g/dL   RDW 11.5 (L) 11.7 - 15.4 %   Platelets 255 150 - 450 x10E3/uL   Neutrophils 56 Not Estab. %   Lymphs 35 Not Estab. %   Monocytes 8 Not Estab. %   Eos 1 Not Estab. %   Basos 0 Not Estab. %   Neutrophils Absolute 3.4 1.4 - 7.0 x10E3/uL   Lymphocytes  Absolute 2.1 0.7 - 3.1 x10E3/uL   Monocytes Absolute 0.5 0.1 - 0.9 x10E3/uL   EOS (ABSOLUTE) 0.1 0.0 - 0.4 x10E3/uL   Basophils Absolute 0.0 0.0 - 0.2 x10E3/uL   Immature Granulocytes 0 Not Estab. %   Immature Grans (Abs) 0.0 0.0 - 0.1 x10E3/uL  Comprehensive metabolic panel  Result Value Ref Range   Glucose 94 65 - 99 mg/dL   BUN 11 6 - 20 mg/dL   Creatinine, Ser 0.56 (L) 0.57 - 1.00 mg/dL   eGFR 122 >59 mL/min/1.73   BUN/Creatinine Ratio 20 9 - 23   Sodium 141 134 - 144 mmol/L   Potassium 4.6 3.5 - 5.2 mmol/L   Chloride 105 96 - 106 mmol/L   CO2 21 20 - 29 mmol/L   Calcium 9.7 8.7 - 10.2 mg/dL   Total Protein 5.9 (L) 6.0 - 8.5 g/dL   Albumin 4.0 3.8 - 4.8 g/dL   Globulin, Total 1.9 1.5 - 4.5 g/dL   Albumin/Globulin Ratio 2.1 1.2 - 2.2   Bilirubin Total 0.4 0.0 - 1.2 mg/dL   Alkaline Phosphatase 114 44 - 121 IU/L   AST 24 0 - 40 IU/L   ALT 15 0 - 32 IU/L  Lipid panel  Result Value Ref Range   Cholesterol, Total 106 100 - 199 mg/dL   Triglycerides 100 0 - 149 mg/dL   HDL 38 (L) >39 mg/dL   VLDL Cholesterol Cal 19 5 - 40 mg/dL   LDL Chol Calc (NIH) 49 0 - 99 mg/dL   Chol/HDL Ratio 2.8 0.0 - 4.4 ratio  TSH  Result Value Ref Range   TSH <0.005 (L) 0.450 - 4.500 uIU/mL  Urinalysis, Routine w reflex microscopic  Result Value Ref Range   Specific Gravity, UA 1.020 1.005 - 1.030   pH, UA 5.0 5.0 - 7.5   Color, UA Yellow Yellow   Appearance Ur Cloudy (A) Clear   Leukocytes,UA Trace (A) Negative   Protein,UA Negative Negative/Trace   Glucose, UA Negative Negative   Ketones, UA Negative Negative   RBC, UA Negative Negative   Bilirubin, UA Negative Negative   Urobilinogen, Ur 0.2 0.2 - 1.0 mg/dL   Nitrite, UA Negative Negative   Microscopic Examination See below:   Hepatitis C Antibody  Result Value Ref Range   Hep C Virus Ab <0.1 0.0 - 0.9 s/co ratio  Pregnancy, urine  Result Value Ref  Range   Preg Test, Ur Negative Negative      Assessment & Plan:   Problem List  Items Addressed This Visit      Cardiovascular and Mediastinum   Hypertension 137/91 on 04/27/21 - Primary     Follow up plan: No follow-ups on file.

## 2022-05-25 ENCOUNTER — Encounter: Payer: Self-pay | Admitting: Nurse Practitioner

## 2022-05-25 ENCOUNTER — Ambulatory Visit: Payer: Medicaid Other | Admitting: Nurse Practitioner

## 2022-05-25 VITALS — BP 146/93 | HR 87 | Temp 98.5°F | Wt 229.2 lb

## 2022-05-25 DIAGNOSIS — I159 Secondary hypertension, unspecified: Secondary | ICD-10-CM | POA: Diagnosis not present

## 2022-05-25 MED ORDER — VALSARTAN 320 MG PO TABS
320.0000 mg | ORAL_TABLET | Freq: Every day | ORAL | 0 refills | Status: DC
Start: 2022-05-25 — End: 2022-06-22

## 2022-05-25 NOTE — Assessment & Plan Note (Signed)
Chronic.  Not well Controlled.  Continue with Amlodipine and increase valsartan to 320mg  dail.  Return to clinic in 1 months for reevaluation.  Call sooner if concerns arise.

## 2022-05-30 ENCOUNTER — Other Ambulatory Visit: Payer: Self-pay | Admitting: Nurse Practitioner

## 2022-05-30 NOTE — Telephone Encounter (Signed)
Dosage changed to 320mg  on 05/25/2022. Requested Prescriptions  Pending Prescriptions Disp Refills  . valsartan (DIOVAN) 160 MG tablet [Pharmacy Med Name: VALSARTAN 160MG  TABLETS] 30 tablet 0    Sig: TAKE 1 TABLET(160 MG) BY MOUTH DAILY     Cardiovascular:  Angiotensin Receptor Blockers Failed - 05/30/2022 12:03 PM      Failed - Cr in normal range and within 180 days    Creatinine, Ser  Date Value Ref Range Status  06/17/2021 0.56 (L) 0.57 - 1.00 mg/dL Final         Failed - K in normal range and within 180 days    Potassium  Date Value Ref Range Status  06/17/2021 4.6 3.5 - 5.2 mmol/L Final         Failed - Last BP in normal range    BP Readings from Last 1 Encounters:  05/25/22 (!) 146/93         Passed - Patient is not pregnant      Passed - Valid encounter within last 6 months    Recent Outpatient Visits          5 days ago Secondary hypertension   [facility] 07/25/22, NP   1 month ago Secondary hypertension   [facility] Larae Grooms, NP   2 months ago Secondary hypertension   [facility] Larae Grooms, NP   4 months ago Secondary hypertension   [facility] Larae Grooms, NP   5 months ago Secondary hypertension   [facility] Larae Grooms, NP      Future Appointments            In 3 weeks [facility], NP [facility]

## 2022-06-01 DIAGNOSIS — E0590 Thyrotoxicosis, unspecified without thyrotoxic crisis or storm: Secondary | ICD-10-CM | POA: Diagnosis not present

## 2022-06-01 DIAGNOSIS — E6609 Other obesity due to excess calories: Secondary | ICD-10-CM | POA: Diagnosis not present

## 2022-06-01 DIAGNOSIS — I10 Essential (primary) hypertension: Secondary | ICD-10-CM | POA: Diagnosis not present

## 2022-06-01 DIAGNOSIS — E049 Nontoxic goiter, unspecified: Secondary | ICD-10-CM | POA: Diagnosis not present

## 2022-06-08 DIAGNOSIS — I10 Essential (primary) hypertension: Secondary | ICD-10-CM | POA: Diagnosis not present

## 2022-06-08 DIAGNOSIS — E049 Nontoxic goiter, unspecified: Secondary | ICD-10-CM | POA: Diagnosis not present

## 2022-06-08 DIAGNOSIS — E0590 Thyrotoxicosis, unspecified without thyrotoxic crisis or storm: Secondary | ICD-10-CM | POA: Diagnosis not present

## 2022-06-08 DIAGNOSIS — E6609 Other obesity due to excess calories: Secondary | ICD-10-CM | POA: Diagnosis not present

## 2022-06-22 ENCOUNTER — Encounter: Payer: Self-pay | Admitting: Nurse Practitioner

## 2022-06-22 ENCOUNTER — Ambulatory Visit: Payer: Medicaid Other | Admitting: Nurse Practitioner

## 2022-06-22 DIAGNOSIS — I159 Secondary hypertension, unspecified: Secondary | ICD-10-CM | POA: Diagnosis not present

## 2022-06-22 MED ORDER — AMLODIPINE BESYLATE 10 MG PO TABS: 10.0000 mg | ORAL_TABLET | Freq: Every day | ORAL | 1 refills | Status: DC

## 2022-06-22 MED ORDER — VALSARTAN 320 MG PO TABS
320.0000 mg | ORAL_TABLET | Freq: Every day | ORAL | 1 refills | Status: DC
Start: 2022-06-22 — End: 2022-09-21

## 2022-06-22 NOTE — Progress Notes (Signed)
BP 136/89   Pulse 78   Temp 98 F (36.7 C) (Oral)   Wt 215 lb 12.8 oz (97.9 kg)   LMP 06/08/2022 (Approximate)   SpO2 100%   BMI 36.31 kg/m    Subjective:    Patient ID: Jill [name], female    DOB: 05/22/1986, 36 y.o.   MRN: [id number]  HPI: Jill [name] is a 36 y.o. female  Chief Complaint  Patient presents with   Hypertension    1 month follow up    HYPERTENSION with Chronic Kidney Disease Hypertension status: controlled  Satisfied with current treatment? no Duration of hypertension: years BP monitoring frequency:  a few times a month BP range: 130/80 BP medication side effects:  no Medication compliance: excellent compliance Previous BP meds:amlodipine and valsartan Aspirin: no Recurrent headaches: no Visual changes: no Palpitations: no Dyspnea: no Chest pain: no Lower extremity edema: no Dizzy/lightheaded: no  Relevant past medical, surgical, family and social history reviewed and updated as indicated. Interim medical history since our last visit reviewed. Allergies and medications reviewed and updated.  Review of Systems  Eyes:  Negative for visual disturbance.  Respiratory:  Negative for cough, chest tightness and shortness of breath.   Cardiovascular:  Negative for chest pain, palpitations and leg swelling.  Neurological:  Negative for dizziness and headaches.    Per HPI unless specifically indicated above     Objective:    BP 136/89   Pulse 78   Temp 98 F (36.7 C) (Oral)   Wt 215 lb 12.8 oz (97.9 kg)   LMP 06/08/2022 (Approximate)   SpO2 100%   BMI 36.31 kg/m   Wt Readings from Last 3 Encounters:  06/22/22 215 lb 12.8 oz (97.9 kg)  05/25/22 229 lb 3.2 oz (104 kg)  04/24/22 227 lb (103 kg)    Physical Exam Vitals and nursing note reviewed.  Constitutional:      General: She is not in acute distress.    Appearance: Normal appearance. She is obese. She is not ill-appearing, toxic-appearing or diaphoretic.  HENT:     Head:  Normocephalic.     Right Ear: External ear normal.     Left Ear: External ear normal.     Nose: Nose normal.     Mouth/Throat:     Mouth: Mucous membranes are moist.     Pharynx: Oropharynx is clear.  Eyes:     General:        Right eye: No discharge.        Left eye: No discharge.     Extraocular Movements: Extraocular movements intact.     Conjunctiva/sclera: Conjunctivae normal.     Pupils: Pupils are equal, round, and reactive to light.  Cardiovascular:     Rate and Rhythm: Normal rate and regular rhythm.     Heart sounds: No murmur heard. Pulmonary:     Effort: Pulmonary effort is normal. No respiratory distress.     Breath sounds: Normal breath sounds. No wheezing or rales.  Musculoskeletal:     Cervical back: Normal range of motion and neck supple.  Skin:    General: Skin is warm and dry.     Capillary Refill: Capillary refill takes less than 2 seconds.  Neurological:     General: No focal deficit present.     Mental Status: She is alert and oriented to person, place, and time. Mental status is at baseline.  Psychiatric:        Mood and Affect: Mood normal.  Behavior: Behavior normal.        Thought Content: Thought content normal.        Judgment: Judgment normal.     Results for orders placed or performed in visit on 06/17/21  Urine Culture   Specimen: Urine   UR  Result Value Ref Range   Urine Culture, Routine Final report    Organism ID, Bacteria Comment   Microscopic Examination   Urine  Result Value Ref Range   WBC, UA 0-5 0 - 5 /hpf   RBC, Urine 0-2 0 - 2 /hpf   Epithelial Cells (non renal) 0-10 0 - 10 /hpf   Mucus, UA Present (A) Not Estab.   Bacteria, UA Many (A) None seen/Few   Trichomonas, UA Present (A) None seen  CBC with Differential/Platelet  Result Value Ref Range   WBC 5.9 3.4 - 10.8 x10E3/uL   RBC 4.40 3.77 - 5.28 x10E6/uL   Hemoglobin 12.6 11.1 - 15.9 g/dL   Hematocrit 39.1 34.0 - 46.6 %   MCV 89 79 - 97 fL   MCH 28.6 26.6 -  33.0 pg   MCHC 32.2 31.5 - 35.7 g/dL   RDW 11.5 (L) 11.7 - 15.4 %   Platelets 255 150 - 450 x10E3/uL   Neutrophils 56 Not Estab. %   Lymphs 35 Not Estab. %   Monocytes 8 Not Estab. %   Eos 1 Not Estab. %   Basos 0 Not Estab. %   Neutrophils Absolute 3.4 1.4 - 7.0 x10E3/uL   Lymphocytes Absolute 2.1 0.7 - 3.1 x10E3/uL   Monocytes Absolute 0.5 0.1 - 0.9 x10E3/uL   EOS (ABSOLUTE) 0.1 0.0 - 0.4 x10E3/uL   Basophils Absolute 0.0 0.0 - 0.2 x10E3/uL   Immature Granulocytes 0 Not Estab. %   Immature Grans (Abs) 0.0 0.0 - 0.1 x10E3/uL  Comprehensive metabolic panel  Result Value Ref Range   Glucose 94 65 - 99 mg/dL   BUN 11 6 - 20 mg/dL   Creatinine, Ser 0.56 (L) 0.57 - 1.00 mg/dL   eGFR 122 >59 mL/min/1.73   BUN/Creatinine Ratio 20 9 - 23   Sodium 141 134 - 144 mmol/L   Potassium 4.6 3.5 - 5.2 mmol/L   Chloride 105 96 - 106 mmol/L   CO2 21 20 - 29 mmol/L   Calcium 9.7 8.7 - 10.2 mg/dL   Total Protein 5.9 (L) 6.0 - 8.5 g/dL   Albumin 4.0 3.8 - 4.8 g/dL   Globulin, Total 1.9 1.5 - 4.5 g/dL   Albumin/Globulin Ratio 2.1 1.2 - 2.2   Bilirubin Total 0.4 0.0 - 1.2 mg/dL   Alkaline Phosphatase 114 44 - 121 IU/L   AST 24 0 - 40 IU/L   ALT 15 0 - 32 IU/L  Lipid panel  Result Value Ref Range   Cholesterol, Total 106 100 - 199 mg/dL   Triglycerides 100 0 - 149 mg/dL   HDL 38 (L) >39 mg/dL   VLDL Cholesterol Cal 19 5 - 40 mg/dL   LDL Chol Calc (NIH) 49 0 - 99 mg/dL   Chol/HDL Ratio 2.8 0.0 - 4.4 ratio  TSH  Result Value Ref Range   TSH <0.005 (L) 0.450 - 4.500 uIU/mL  Urinalysis, Routine w reflex microscopic  Result Value Ref Range   Specific Gravity, UA 1.020 1.005 - 1.030   pH, UA 5.0 5.0 - 7.5   Color, UA Yellow Yellow   Appearance Ur Cloudy (A) Clear   Leukocytes,UA Trace (A) Negative   Protein,UA  Negative Negative/Trace   Glucose, UA Negative Negative   Ketones, UA Negative Negative   RBC, UA Negative Negative   Bilirubin, UA Negative Negative   Urobilinogen, Ur 0.2 0.2 -  1.0 mg/dL   Nitrite, UA Negative Negative   Microscopic Examination See below:   Hepatitis C Antibody  Result Value Ref Range   Hep C Virus Ab <0.1 0.0 - 0.9 s/co ratio  Pregnancy, urine  Result Value Ref Range   Preg Test, Ur Negative Negative      Assessment & Plan:   Problem List Items Addressed This Visit       Cardiovascular and Mediastinum   Hypertension 137/91 on 04/27/21    Chronic.  Controlled.  Continue with current medication regimen of Amlodipine 25m and Valsartan 3215m  Refills sent today.  Return to clinic in 3 months for reevaluation.  Call sooner if concerns arise.        Relevant Medications   amLODipine (NORVASC) 10 MG tablet   valsartan (DIOVAN) 320 MG tablet     Follow up plan: Return in about 3 months (around 09/21/2022) for BP Check.

## 2022-06-22 NOTE — Assessment & Plan Note (Signed)
Chronic.  Controlled.  Continue with current medication regimen of Amlodipine 10mg  and Valsartan 320mg .  Refills sent today.  Return to clinic in 3 months for reevaluation.  Call sooner if concerns arise.

## 2022-07-27 DIAGNOSIS — E6609 Other obesity due to excess calories: Secondary | ICD-10-CM | POA: Diagnosis not present

## 2022-07-27 DIAGNOSIS — E049 Nontoxic goiter, unspecified: Secondary | ICD-10-CM | POA: Diagnosis not present

## 2022-07-27 DIAGNOSIS — Z6834 Body mass index (BMI) 34.0-34.9, adult: Secondary | ICD-10-CM | POA: Diagnosis not present

## 2022-07-27 DIAGNOSIS — E0590 Thyrotoxicosis, unspecified without thyrotoxic crisis or storm: Secondary | ICD-10-CM | POA: Diagnosis not present

## 2022-07-27 DIAGNOSIS — I10 Essential (primary) hypertension: Secondary | ICD-10-CM | POA: Diagnosis not present

## 2022-08-14 DIAGNOSIS — I10 Essential (primary) hypertension: Secondary | ICD-10-CM | POA: Diagnosis not present

## 2022-08-14 DIAGNOSIS — Z6833 Body mass index (BMI) 33.0-33.9, adult: Secondary | ICD-10-CM | POA: Diagnosis not present

## 2022-08-14 DIAGNOSIS — E0590 Thyrotoxicosis, unspecified without thyrotoxic crisis or storm: Secondary | ICD-10-CM | POA: Diagnosis not present

## 2022-08-14 DIAGNOSIS — E049 Nontoxic goiter, unspecified: Secondary | ICD-10-CM | POA: Diagnosis not present

## 2022-08-14 DIAGNOSIS — E6609 Other obesity due to excess calories: Secondary | ICD-10-CM | POA: Diagnosis not present

## 2022-09-21 ENCOUNTER — Encounter: Payer: Self-pay | Admitting: Nurse Practitioner

## 2022-09-21 ENCOUNTER — Ambulatory Visit (INDEPENDENT_AMBULATORY_CARE_PROVIDER_SITE_OTHER): Payer: Medicaid Other | Admitting: Nurse Practitioner

## 2022-09-21 VITALS — BP 113/80 | HR 116 | Temp 98.2°F | Wt 200.0 lb

## 2022-09-21 DIAGNOSIS — E0500 Thyrotoxicosis with diffuse goiter without thyrotoxic crisis or storm: Secondary | ICD-10-CM | POA: Diagnosis not present

## 2022-09-21 DIAGNOSIS — Z136 Encounter for screening for cardiovascular disorders: Secondary | ICD-10-CM | POA: Diagnosis not present

## 2022-09-21 DIAGNOSIS — I159 Secondary hypertension, unspecified: Secondary | ICD-10-CM

## 2022-09-21 DIAGNOSIS — Z0000 Encounter for general adult medical examination without abnormal findings: Secondary | ICD-10-CM

## 2022-09-21 LAB — MICROSCOPIC EXAMINATION: Bacteria, UA: NONE SEEN

## 2022-09-21 LAB — URINALYSIS, ROUTINE W REFLEX MICROSCOPIC
Bilirubin, UA: NEGATIVE
Glucose, UA: NEGATIVE
Ketones, UA: NEGATIVE
Leukocytes,UA: NEGATIVE
Nitrite, UA: NEGATIVE
Protein,UA: NEGATIVE
Specific Gravity, UA: 1.025 (ref 1.005–1.030)
Urobilinogen, Ur: 0.2 mg/dL (ref 0.2–1.0)
pH, UA: 5.5 (ref 5.0–7.5)

## 2022-09-21 MED ORDER — AMLODIPINE BESYLATE 10 MG PO TABS: 10.0000 mg | ORAL_TABLET | Freq: Every day | ORAL | 1 refills | Status: DC

## 2022-09-21 MED ORDER — VALSARTAN 320 MG PO TABS: 320.0000 mg | ORAL_TABLET | Freq: Every day | ORAL | 1 refills | Status: DC

## 2022-09-21 NOTE — Assessment & Plan Note (Signed)
Recommended eating smaller high protein, low fat meals more frequently and exercising 30 mins a day 5 times a week with a goal of 10-15lb weight loss in the next 3 months. Patient voiced their understanding and motivation to adhere to these recommendations.  

## 2022-09-21 NOTE — Assessment & Plan Note (Signed)
Chronic.  Controlled.  Continue with current medication regimen of Amlodipine and Valsartan 160mg . Refills sent today.  Labs ordered today.  Return to clinic in 6 months for reevaluation.  Call sooner if concerns arise.

## 2022-09-21 NOTE — Progress Notes (Signed)
BP 113/80   Pulse (!) 116   Temp 98.2 F (36.8 C) (Oral)   Wt 200 lb (90.7 kg)   SpO2 100%   BMI 33.65 kg/m    Subjective:    Patient ID: Jill [name], female    DOB: 05-11-1986, 36 y.o.   MRN: [id number]  HPI: Jill [name] is a 36 y.o. female presenting on 09/21/2022 for comprehensive medical examination. Current medical complaints include:none  She currently lives with: Menopausal Symptoms: no  HYPERTENSION Hypertension status: controlled  Satisfied with current treatment? no Duration of hypertension: years BP monitoring frequency:  not checking BP range:  BP medication side effects:  no Medication compliance: excellent compliance Previous BP meds:amlodipine and valsartan Aspirin: no Recurrent headaches: no Visual changes: no Palpitations: no Dyspnea: no Chest pain: no Lower extremity edema: no Dizzy/lightheaded: no    Depression Screen done today and results listed below:     09/21/2022    1:27 PM 06/22/2022    1:50 PM 05/25/2022   11:13 AM 04/24/2022   11:12 AM 03/23/2022    3:00 PM  Depression screen PHQ 2/9  Decreased Interest 0 0 0 0 0  Down, Depressed, Hopeless 0 0 0 0 0  PHQ - 2 Score 0 0 0 0 0  Altered sleeping 0 0 0 0 0  Tired, decreased energy 0 0 0 0 0  Change in appetite 0 0 0 0 0  Feeling bad or failure about yourself  0 0 0 0 0  Trouble concentrating 0 0 0 0 0  Moving slowly or fidgety/restless 0 0 0 0 0  Suicidal thoughts 0 0 0 0 0  PHQ-9 Score 0 0 0 0 0  Difficult doing work/chores Not difficult at all Not difficult at all Not difficult at all Not difficult at all Not difficult at all    The patient does not have a history of falls. I did complete a risk assessment for falls. A plan of care for falls was documented.   Past Medical History:  Past Medical History:  Diagnosis Date   History of chlamydia 01/22/2021   History of gonorrhea 04/11/2020   Hypertension 03/22/2016    Surgical History:  Past Surgical History:   Procedure Laterality Date   DIAGNOSTIC LAPAROSCOPY WITH REMOVAL OF ECTOPIC PREGNANCY Left 03/21/2021   Procedure: DIAGNOSTIC LAPAROSCOPY WITH REMOVAL OF ECTOPIC PREGNANCY;  Surgeon: Rubie Maid, MD;  Location: [facility] ORS;  Service: Gynecology;  Laterality: Left;   NO PAST SURGERIES      Medications:  Current Outpatient Medications on File Prior to Visit  Medication Sig   etonogestrel (NEXPLANON) 68 MG IMPL implant 1 each by Subdermal route once.   methimazole (TAPAZOLE) 10 MG tablet Take 20 mg by mouth every morning.   No current facility-administered medications on file prior to visit.    Allergies:  No Known Allergies  Social History:  Social History   Socioeconomic History   Marital status: Single    Spouse name: Not on file   Number of children: Not on file   Years of education: Not on file   Highest education level: Not on file  Occupational History   Not on file  Tobacco Use   Smoking status: Former    Types: Cigarettes    Start date: 10/23/2010   Smokeless tobacco: Never   Tobacco comments:    Stopped about 1 month ago  Vaping Use   Vaping Use: Never used  Substance and Sexual Activity   Alcohol use:  Yes    Alcohol/week: 1.0 standard drink of alcohol    Types: 1 Standard drinks or equivalent per week    Comment: last use 11/2020   Drug use: Never   Sexual activity: Yes    Partners: Male    Birth control/protection: Implant  Other Topics Concern   Not on file  Social History Narrative   Not on file   Social Determinants of Health   Financial Resource Strain: Not on file  Food Insecurity: Not on file  Transportation Needs: Not on file  Physical Activity: Not on file  Stress: Not on file  Social Connections: Not on file  Intimate Partner Violence: Not At Risk (04/27/2021)   Humiliation, Afraid, Rape, and Kick questionnaire    Fear of Current or Ex-Partner: No    Emotionally Abused: No    Physically Abused: No    Sexually Abused: No   Social History    Tobacco Use  Smoking Status Former   Types: Cigarettes   Start date: 10/23/2010  Smokeless Tobacco Never  Tobacco Comments   Stopped about 1 month ago   Social History   Substance and Sexual Activity  Alcohol Use Yes   Alcohol/week: 1.0 standard drink of alcohol   Types: 1 Standard drinks or equivalent per week   Comment: last use 11/2020    Family History:  Family History  Problem Relation Age of Onset   Hypertension Father    Diabetes Father    Hypertension Mother    Diabetes Mother     Past medical history, surgical history, medications, allergies, family history and social history reviewed with patient today and changes made to appropriate areas of the chart.   Review of Systems  Eyes:  Negative for blurred vision and double vision.  Respiratory:  Negative for shortness of breath.   Cardiovascular:  Negative for chest pain, palpitations and leg swelling.  Neurological:  Negative for dizziness and headaches.   All other ROS negative except what is listed above and in the HPI.      Objective:    BP 113/80   Pulse (!) 116   Temp 98.2 F (36.8 C) (Oral)   Wt 200 lb (90.7 kg)   SpO2 100%   BMI 33.65 kg/m   Wt Readings from Last 3 Encounters:  09/21/22 200 lb (90.7 kg)  06/22/22 215 lb 12.8 oz (97.9 kg)  05/25/22 229 lb 3.2 oz (104 kg)    Physical Exam Vitals and nursing note reviewed.  Constitutional:      General: She is awake. She is not in acute distress.    Appearance: Normal appearance. She is well-developed. She is not ill-appearing.  HENT:     Head: Normocephalic and atraumatic.     Right Ear: Hearing, tympanic membrane, ear canal and external ear normal. No drainage.     Left Ear: Hearing, tympanic membrane, ear canal and external ear normal. No drainage.     Nose: Nose normal.     Right Sinus: No maxillary sinus tenderness or frontal sinus tenderness.     Left Sinus: No maxillary sinus tenderness or frontal sinus tenderness.     Mouth/Throat:      Mouth: Mucous membranes are moist.     Pharynx: Oropharynx is clear. Uvula midline. No pharyngeal swelling, oropharyngeal exudate or posterior oropharyngeal erythema.  Eyes:     General: Lids are normal.        Right eye: No discharge.        Left eye:  No discharge.     Extraocular Movements: Extraocular movements intact.     Conjunctiva/sclera: Conjunctivae normal.     Pupils: Pupils are equal, round, and reactive to light.     Visual Fields: Right eye visual fields normal and left eye visual fields normal.  Neck:     Thyroid: No thyromegaly.     Vascular: No carotid bruit.     Trachea: Trachea normal.  Cardiovascular:     Rate and Rhythm: Regular rhythm. Tachycardia present.     Heart sounds: Normal heart sounds. No murmur heard.    No gallop.  Pulmonary:     Effort: Pulmonary effort is normal. No accessory muscle usage or respiratory distress.     Breath sounds: Normal breath sounds.  Chest:  Breasts:    Right: Normal.     Left: Normal.  Abdominal:     General: Bowel sounds are normal.     Palpations: Abdomen is soft. There is no hepatomegaly or splenomegaly.     Tenderness: There is no abdominal tenderness.  Musculoskeletal:        General: Normal range of motion.     Cervical back: Normal range of motion and neck supple.     Right lower leg: No edema.     Left lower leg: No edema.  Lymphadenopathy:     Head:     Right side of head: No submental, submandibular, tonsillar, preauricular or posterior auricular adenopathy.     Left side of head: No submental, submandibular, tonsillar, preauricular or posterior auricular adenopathy.     Cervical: No cervical adenopathy.     Upper Body:     Right upper body: No supraclavicular, axillary or pectoral adenopathy.     Left upper body: No supraclavicular, axillary or pectoral adenopathy.  Skin:    General: Skin is warm and dry.     Capillary Refill: Capillary refill takes less than 2 seconds.     Findings: No rash.   Neurological:     Mental Status: She is alert and oriented to person, place, and time.     Gait: Gait is intact.     Deep Tendon Reflexes: Reflexes are normal and symmetric.     Reflex Scores:      Brachioradialis reflexes are 2+ on the right side and 2+ on the left side.      Patellar reflexes are 2+ on the right side and 2+ on the left side. Psychiatric:        Attention and Perception: Attention normal.        Mood and Affect: Mood normal.        Speech: Speech normal.        Behavior: Behavior normal. Behavior is cooperative.        Thought Content: Thought content normal.        Judgment: Judgment normal.     Results for orders placed or performed in visit on 06/17/21  Urine Culture   Specimen: Urine   UR  Result Value Ref Range   Urine Culture, Routine Final report    Organism ID, Bacteria Comment   Microscopic Examination   Urine  Result Value Ref Range   WBC, UA 0-5 0 - 5 /hpf   RBC, Urine 0-2 0 - 2 /hpf   Epithelial Cells (non renal) 0-10 0 - 10 /hpf   Mucus, UA Present (A) Not Estab.   Bacteria, UA Many (A) None seen/Few   Trichomonas, UA Present (A) None seen  CBC with  Differential/Platelet  Result Value Ref Range   WBC 5.9 3.4 - 10.8 x10E3/uL   RBC 4.40 3.77 - 5.28 x10E6/uL   Hemoglobin 12.6 11.1 - 15.9 g/dL   Hematocrit 39.1 34.0 - 46.6 %   MCV 89 79 - 97 fL   MCH 28.6 26.6 - 33.0 pg   MCHC 32.2 31.5 - 35.7 g/dL   RDW 11.5 (L) 11.7 - 15.4 %   Platelets 255 150 - 450 x10E3/uL   Neutrophils 56 Not Estab. %   Lymphs 35 Not Estab. %   Monocytes 8 Not Estab. %   Eos 1 Not Estab. %   Basos 0 Not Estab. %   Neutrophils Absolute 3.4 1.4 - 7.0 x10E3/uL   Lymphocytes Absolute 2.1 0.7 - 3.1 x10E3/uL   Monocytes Absolute 0.5 0.1 - 0.9 x10E3/uL   EOS (ABSOLUTE) 0.1 0.0 - 0.4 x10E3/uL   Basophils Absolute 0.0 0.0 - 0.2 x10E3/uL   Immature Granulocytes 0 Not Estab. %   Immature Grans (Abs) 0.0 0.0 - 0.1 x10E3/uL  Comprehensive metabolic panel  Result Value Ref  Range   Glucose 94 65 - 99 mg/dL   BUN 11 6 - 20 mg/dL   Creatinine, Ser 0.56 (L) 0.57 - 1.00 mg/dL   eGFR 122 >59 mL/min/1.73   BUN/Creatinine Ratio 20 9 - 23   Sodium 141 134 - 144 mmol/L   Potassium 4.6 3.5 - 5.2 mmol/L   Chloride 105 96 - 106 mmol/L   CO2 21 20 - 29 mmol/L   Calcium 9.7 8.7 - 10.2 mg/dL   Total Protein 5.9 (L) 6.0 - 8.5 g/dL   Albumin 4.0 3.8 - 4.8 g/dL   Globulin, Total 1.9 1.5 - 4.5 g/dL   Albumin/Globulin Ratio 2.1 1.2 - 2.2   Bilirubin Total 0.4 0.0 - 1.2 mg/dL   Alkaline Phosphatase 114 44 - 121 IU/L   AST 24 0 - 40 IU/L   ALT 15 0 - 32 IU/L  Lipid panel  Result Value Ref Range   Cholesterol, Total 106 100 - 199 mg/dL   Triglycerides 100 0 - 149 mg/dL   HDL 38 (L) >39 mg/dL   VLDL Cholesterol Cal 19 5 - 40 mg/dL   LDL Chol Calc (NIH) 49 0 - 99 mg/dL   Chol/HDL Ratio 2.8 0.0 - 4.4 ratio  TSH  Result Value Ref Range   TSH <0.005 (L) 0.450 - 4.500 uIU/mL  Urinalysis, Routine w reflex microscopic  Result Value Ref Range   Specific Gravity, UA 1.020 1.005 - 1.030   pH, UA 5.0 5.0 - 7.5   Color, UA Yellow Yellow   Appearance Ur Cloudy (A) Clear   Leukocytes,UA Trace (A) Negative   Protein,UA Negative Negative/Trace   Glucose, UA Negative Negative   Ketones, UA Negative Negative   RBC, UA Negative Negative   Bilirubin, UA Negative Negative   Urobilinogen, Ur 0.2 0.2 - 1.0 mg/dL   Nitrite, UA Negative Negative   Microscopic Examination See below:   Hepatitis C Antibody  Result Value Ref Range   Hep C Virus Ab <0.1 0.0 - 0.9 s/co ratio  Pregnancy, urine  Result Value Ref Range   Preg Test, Ur Negative Negative      Assessment & Plan:   Problem List Items Addressed This Visit       Cardiovascular and Mediastinum   Hypertension 137/91 on 04/27/21    Chronic.  Controlled.  Continue with current medication regimen of Amlodipine and Valsartan 150m. Refills sent today.  Labs ordered today.  Return to clinic in 6 months for reevaluation.  Call  sooner if concerns arise.        Relevant Medications   amLODipine (NORVASC) 10 MG tablet   valsartan (DIOVAN) 320 MG tablet     Endocrine   Graves disease    Followed by Endocrinology.  On Methimazole.  HR is elevated to 116 and has had a 15lbs since last visit.  Labs ordered today. Will make recommendations based on lab results.       Relevant Orders   T4, free     Other   Morbid obesity ([place]) 246 lbs BMI=34.6    Recommended eating smaller high protein, low fat meals more frequently and exercising 30 mins a day 5 times a week with a goal of 10-15lb weight loss in the next 3 months. Patient voiced their understanding and motivation to adhere to these recommendations.       Other Visit Diagnoses     Annual physical exam    -  Primary   Health maintenance reviewed during visit today.  Labs ordered. Declined Flu. PAP up to date.   Relevant Orders   CBC with Differential/Platelet   Comprehensive metabolic panel   Lipid panel   TSH   Urinalysis, Routine w reflex microscopic   Screening for ischemic heart disease       Relevant Orders   Lipid panel        Follow up plan: Return in about 6 months (around 03/22/2023) for BP Check.   LABORATORY TESTING:  - Pap smear: up to date  IMMUNIZATIONS:   - Tdap: Tetanus vaccination status reviewed: last tetanus booster within 10 years. - Influenza: Postponed to flu season - Pneumovax: Not applicable - Prevnar: Not applicable - HPV: Administered today - Zostavax vaccine: Not applicable  SCREENING: -Mammogram: Not applicable  - Colonoscopy: Not applicable  - Bone Density: Not applicable  -Hearing Test: Not applicable  -Spirometry: Not applicable   PATIENT COUNSELING:   Advised to take 1 mg of folate supplement per day if capable of pregnancy.   Sexuality: Discussed sexually transmitted diseases, partner selection, use of condoms, avoidance of unintended pregnancy  and contraceptive alternatives.   Advised to avoid  cigarette smoking.  I discussed with the patient that most people either abstain from alcohol or drink within safe limits (<=14/week and <=4 drinks/occasion for males, <=7/weeks and <= 3 drinks/occasion for females) and that the risk for alcohol disorders and other health effects rises proportionally with the number of drinks per week and how often a drinker exceeds daily limits.  Discussed cessation/primary prevention of drug use and availability of treatment for abuse.   Diet: Encouraged to adjust caloric intake to maintain  or achieve ideal body weight, to reduce intake of dietary saturated fat and total fat, to limit sodium intake by avoiding high sodium foods and not adding table salt, and to maintain adequate dietary potassium and calcium preferably from fresh fruits, vegetables, and low-fat dairy products.    stressed the importance of regular exercise  Injury prevention: Discussed safety belts, safety helmets, smoke detector, smoking near bedding or upholstery.   Dental health: Discussed importance of regular tooth brushing, flossing, and dental visits.    NEXT PREVENTATIVE PHYSICAL DUE IN 1 YEAR. Return in about 6 months (around 03/22/2023) for BP Check.

## 2022-09-21 NOTE — Assessment & Plan Note (Signed)
Followed by Endocrinology.  On Methimazole.  HR is elevated to 116 and has had a 15lbs since last visit.  Labs ordered today. Will make recommendations based on lab results.

## 2022-09-22 LAB — COMPREHENSIVE METABOLIC PANEL
ALT: 5 IU/L (ref 0–32)
AST: 16 IU/L (ref 0–40)
Albumin/Globulin Ratio: 2 (ref 1.2–2.2)
Albumin: 5 g/dL — ABNORMAL HIGH (ref 3.9–4.9)
Alkaline Phosphatase: 109 IU/L (ref 44–121)
BUN/Creatinine Ratio: 11 (ref 9–23)
BUN: 9 mg/dL (ref 6–20)
Bilirubin Total: 0.7 mg/dL (ref 0.0–1.2)
CO2: 24 mmol/L (ref 20–29)
Calcium: 10 mg/dL (ref 8.7–10.2)
Chloride: 103 mmol/L (ref 96–106)
Creatinine, Ser: 0.84 mg/dL (ref 0.57–1.00)
Globulin, Total: 2.5 g/dL (ref 1.5–4.5)
Glucose: 80 mg/dL (ref 70–99)
Potassium: 4.4 mmol/L (ref 3.5–5.2)
Sodium: 141 mmol/L (ref 134–144)
Total Protein: 7.5 g/dL (ref 6.0–8.5)
eGFR: 92 mL/min/{1.73_m2} (ref >59)

## 2022-09-22 LAB — CBC WITH DIFFERENTIAL/PLATELET
Basophils Absolute: 0.1 10*3/uL (ref 0.0–0.2)
Basos: 1 %
EOS (ABSOLUTE): 0 10*3/uL (ref 0.0–0.4)
Eos: 0 %
Hematocrit: 46.3 % (ref 34.0–46.6)
Hemoglobin: 15.4 g/dL (ref 11.1–15.9)
Immature Grans (Abs): 0 10*3/uL (ref 0.0–0.1)
Immature Granulocytes: 0 %
Lymphocytes Absolute: 2 10*3/uL (ref 0.7–3.1)
Lymphs: 19 %
MCH: 31.4 pg (ref 26.6–33.0)
MCHC: 33.3 g/dL (ref 31.5–35.7)
MCV: 95 fL (ref 79–97)
Monocytes Absolute: 0.5 10*3/uL (ref 0.1–0.9)
Monocytes: 5 %
Neutrophils Absolute: 7.8 10*3/uL — ABNORMAL HIGH (ref 1.4–7.0)
Neutrophils: 75 %
Platelets: 331 10*3/uL (ref 150–450)
RBC: 4.9 x10E6/uL (ref 3.77–5.28)
RDW: 12.1 % (ref 11.7–15.4)
WBC: 10.4 10*3/uL (ref 3.4–10.8)

## 2022-09-22 LAB — LIPID PANEL
Chol/HDL Ratio: 2.3 ratio (ref 0.0–4.4)
Cholesterol, Total: 175 mg/dL (ref 100–199)
HDL: 76 mg/dL (ref >39)
LDL Chol Calc (NIH): 87 mg/dL (ref 0–99)
Triglycerides: 61 mg/dL (ref 0–149)
VLDL Cholesterol Cal: 12 mg/dL (ref 5–40)

## 2022-09-22 LAB — T4, FREE: Free T4: 1.44 ng/dL (ref 0.82–1.77)

## 2022-09-22 LAB — TSH: TSH: 0.592 u[IU]/mL (ref 0.450–4.500)

## 2022-09-22 NOTE — Progress Notes (Signed)
Please let patient know that her lab work looks good.  Her thyroid labs are within normal limits.  Liver, kidneys and electrolytes look good.  No concerns at this time.  Continue with current medication regimen. Follow up as discussed.

## 2022-10-09 DIAGNOSIS — E049 Nontoxic goiter, unspecified: Secondary | ICD-10-CM | POA: Diagnosis not present

## 2022-10-09 DIAGNOSIS — I10 Essential (primary) hypertension: Secondary | ICD-10-CM | POA: Diagnosis not present

## 2022-10-09 DIAGNOSIS — E0590 Thyrotoxicosis, unspecified without thyrotoxic crisis or storm: Secondary | ICD-10-CM | POA: Diagnosis not present

## 2022-10-19 DIAGNOSIS — E0590 Thyrotoxicosis, unspecified without thyrotoxic crisis or storm: Secondary | ICD-10-CM | POA: Diagnosis not present

## 2022-10-19 DIAGNOSIS — E049 Nontoxic goiter, unspecified: Secondary | ICD-10-CM | POA: Diagnosis not present

## 2022-10-19 DIAGNOSIS — E6609 Other obesity due to excess calories: Secondary | ICD-10-CM | POA: Diagnosis not present

## 2022-10-19 DIAGNOSIS — I10 Essential (primary) hypertension: Secondary | ICD-10-CM | POA: Diagnosis not present

## 2022-10-19 DIAGNOSIS — Z6833 Body mass index (BMI) 33.0-33.9, adult: Secondary | ICD-10-CM | POA: Diagnosis not present

## 2023-01-08 DIAGNOSIS — E0590 Thyrotoxicosis, unspecified without thyrotoxic crisis or storm: Secondary | ICD-10-CM | POA: Diagnosis not present

## 2023-01-08 DIAGNOSIS — E049 Nontoxic goiter, unspecified: Secondary | ICD-10-CM | POA: Diagnosis not present

## 2023-01-08 DIAGNOSIS — I10 Essential (primary) hypertension: Secondary | ICD-10-CM | POA: Diagnosis not present

## 2023-01-15 DIAGNOSIS — I10 Essential (primary) hypertension: Secondary | ICD-10-CM | POA: Diagnosis not present

## 2023-01-15 DIAGNOSIS — E049 Nontoxic goiter, unspecified: Secondary | ICD-10-CM | POA: Diagnosis not present

## 2023-01-15 DIAGNOSIS — Z6831 Body mass index (BMI) 31.0-31.9, adult: Secondary | ICD-10-CM | POA: Diagnosis not present

## 2023-01-15 DIAGNOSIS — E6609 Other obesity due to excess calories: Secondary | ICD-10-CM | POA: Diagnosis not present

## 2023-01-15 DIAGNOSIS — E0590 Thyrotoxicosis, unspecified without thyrotoxic crisis or storm: Secondary | ICD-10-CM | POA: Diagnosis not present

## 2023-03-22 ENCOUNTER — Ambulatory Visit: Payer: Medicaid Other | Admitting: Nurse Practitioner

## 2023-04-09 ENCOUNTER — Encounter: Payer: Self-pay | Admitting: Nurse Practitioner

## 2023-04-09 ENCOUNTER — Ambulatory Visit (INDEPENDENT_AMBULATORY_CARE_PROVIDER_SITE_OTHER): Payer: Medicaid Other | Admitting: Nurse Practitioner

## 2023-04-09 VITALS — BP 144/102 | HR 71 | Temp 98.1°F | Wt 180.2 lb

## 2023-04-09 DIAGNOSIS — I10 Essential (primary) hypertension: Secondary | ICD-10-CM | POA: Diagnosis not present

## 2023-04-09 DIAGNOSIS — E0590 Thyrotoxicosis, unspecified without thyrotoxic crisis or storm: Secondary | ICD-10-CM | POA: Diagnosis not present

## 2023-04-09 DIAGNOSIS — I159 Secondary hypertension, unspecified: Secondary | ICD-10-CM

## 2023-04-09 DIAGNOSIS — E049 Nontoxic goiter, unspecified: Secondary | ICD-10-CM | POA: Diagnosis not present

## 2023-04-09 NOTE — Progress Notes (Signed)
BP (!) 144/102   Pulse 71   Temp 98.1 F (36.7 C) (Oral)   Wt 180 lb 3.2 oz (81.7 kg)   LMP 03/25/2023 (Approximate)   SpO2 100%   BMI 30.32 kg/m    Subjective:    Patient ID: Jill [name], female    DOB: 1986-07-06, 37 y.o.   MRN: [id number]  HPI: Jill [name] is a 37 y.o. female  Chief Complaint  Patient presents with   Hypertension   HYPERTENSION without Chronic Kidney Disease Patient states she hasn't been taking her medications everyday.  She forgets and then she forgets.   Hypertension status: uncontrolled  Satisfied with current treatment? yes Duration of hypertension: years BP monitoring frequency:  not checking BP range:  BP medication side effects:  no Medication compliance: excellent compliance Previous BP meds:amlodipine and valsartan Aspirin: no Recurrent headaches: no Visual changes: no Palpitations: no Dyspnea: no Chest pain: no Lower extremity edema: no Dizzy/lightheaded: no  Relevant past medical, surgical, family and social history reviewed and updated as indicated. Interim medical history since our last visit reviewed. Allergies and medications reviewed and updated.  Review of Systems  Eyes:  Negative for visual disturbance.  Respiratory:  Negative for cough, chest tightness and shortness of breath.   Cardiovascular:  Negative for chest pain, palpitations and leg swelling.  Neurological:  Negative for dizziness and headaches.    Per HPI unless specifically indicated above     Objective:    BP (!) 144/102   Pulse 71   Temp 98.1 F (36.7 C) (Oral)   Wt 180 lb 3.2 oz (81.7 kg)   LMP 03/25/2023 (Approximate)   SpO2 100%   BMI 30.32 kg/m   Wt Readings from Last 3 Encounters:  04/09/23 180 lb 3.2 oz (81.7 kg)  09/21/22 200 lb (90.7 kg)  06/22/22 215 lb 12.8 oz (97.9 kg)    Physical Exam Vitals and nursing note reviewed.  Constitutional:      General: She is not in acute distress.    Appearance: Normal appearance. She  is normal weight. She is not ill-appearing, toxic-appearing or diaphoretic.  HENT:     Head: Normocephalic.     Right Ear: External ear normal.     Left Ear: External ear normal.     Nose: Nose normal.     Mouth/Throat:     Mouth: Mucous membranes are moist.     Pharynx: Oropharynx is clear.  Eyes:     General:        Right eye: No discharge.        Left eye: No discharge.     Extraocular Movements: Extraocular movements intact.     Conjunctiva/sclera: Conjunctivae normal.     Pupils: Pupils are equal, round, and reactive to light.  Cardiovascular:     Rate and Rhythm: Normal rate and regular rhythm.     Heart sounds: No murmur heard. Pulmonary:     Effort: Pulmonary effort is normal. No respiratory distress.     Breath sounds: Normal breath sounds. No wheezing or rales.  Musculoskeletal:     Cervical back: Normal range of motion and neck supple.  Skin:    General: Skin is warm and dry.     Capillary Refill: Capillary refill takes less than 2 seconds.  Neurological:     General: No focal deficit present.     Mental Status: She is alert and oriented to person, place, and time. Mental status is at baseline.  Psychiatric:  Mood and Affect: Mood normal.        Behavior: Behavior normal.        Thought Content: Thought content normal.        Judgment: Judgment normal.     Results for orders placed or performed in visit on 09/21/22  Microscopic Examination   Urine  Result Value Ref Range   WBC, UA 0-5 0 - 5 /hpf   RBC, Urine 3-10 (A) 0 - 2 /hpf   Epithelial Cells (non renal) 0-10 0 - 10 /hpf   Mucus, UA Present (A) Not Estab.   Bacteria, UA None seen None seen/Few  CBC with Differential/Platelet  Result Value Ref Range   WBC 10.4 3.4 - 10.8 x10E3/uL   RBC 4.90 3.77 - 5.28 x10E6/uL   Hemoglobin 15.4 11.1 - 15.9 g/dL   Hematocrit 56.2 13.0 - 46.6 %   MCV 95 79 - 97 fL   MCH 31.4 26.6 - 33.0 pg   MCHC 33.3 31.5 - 35.7 g/dL   RDW 86.5 78.4 - 69.6 %   Platelets 331  150 - 450 x10E3/uL   Neutrophils 75 Not Estab. %   Lymphs 19 Not Estab. %   Monocytes 5 Not Estab. %   Eos 0 Not Estab. %   Basos 1 Not Estab. %   Neutrophils Absolute 7.8 (H) 1.4 - 7.0 x10E3/uL   Lymphocytes Absolute 2.0 0.7 - 3.1 x10E3/uL   Monocytes Absolute 0.5 0.1 - 0.9 x10E3/uL   EOS (ABSOLUTE) 0.0 0.0 - 0.4 x10E3/uL   Basophils Absolute 0.1 0.0 - 0.2 x10E3/uL   Immature Granulocytes 0 Not Estab. %   Immature Grans (Abs) 0.0 0.0 - 0.1 x10E3/uL  Comprehensive metabolic panel  Result Value Ref Range   Glucose 80 70 - 99 mg/dL   BUN 9 6 - 20 mg/dL   Creatinine, Ser 2.95 0.57 - 1.00 mg/dL   eGFR 92 >28 UX/LKG/4.01   BUN/Creatinine Ratio 11 9 - 23   Sodium 141 134 - 144 mmol/L   Potassium 4.4 3.5 - 5.2 mmol/L   Chloride 103 96 - 106 mmol/L   CO2 24 20 - 29 mmol/L   Calcium 10.0 8.7 - 10.2 mg/dL   Total Protein 7.5 6.0 - 8.5 g/dL   Albumin 5.0 (H) 3.9 - 4.9 g/dL   Globulin, Total 2.5 1.5 - 4.5 g/dL   Albumin/Globulin Ratio 2.0 1.2 - 2.2   Bilirubin Total 0.7 0.0 - 1.2 mg/dL   Alkaline Phosphatase 109 44 - 121 IU/L   AST 16 0 - 40 IU/L   ALT 5 0 - 32 IU/L  Lipid panel  Result Value Ref Range   Cholesterol, Total 175 100 - 199 mg/dL   Triglycerides 61 0 - 149 mg/dL   HDL 76 >02 mg/dL   VLDL Cholesterol Cal 12 5 - 40 mg/dL   LDL Chol Calc (NIH) 87 0 - 99 mg/dL   Chol/HDL Ratio 2.3 0.0 - 4.4 ratio  TSH  Result Value Ref Range   TSH 0.592 0.450 - 4.500 uIU/mL  Urinalysis, Routine w reflex microscopic  Result Value Ref Range   Specific Gravity, UA 1.025 1.005 - 1.030   pH, UA 5.5 5.0 - 7.5   Color, UA Yellow Yellow   Appearance Ur Clear Clear   Leukocytes,UA Negative Negative   Protein,UA Negative Negative/Trace   Glucose, UA Negative Negative   Ketones, UA Negative Negative   RBC, UA 2+ (A) Negative   Bilirubin, UA Negative Negative   Urobilinogen,  Ur 0.2 0.2 - 1.0 mg/dL   Nitrite, UA Negative Negative   Microscopic Examination See below:   T4, free  Result  Value Ref Range   Free T4 1.44 0.82 - 1.77 ng/dL      Assessment & Plan:   Problem List Items Addressed This Visit       Cardiovascular and Mediastinum   Hypertension 137/91 on 04/27/21 - Primary    Chronic.  Not well controlled.  Patient has not been consistent with taking medications.  States she believes it has been 3 days since the last time she took her medication.  Discussed importance of taking medications daily and long term effects of uncontrolled blood pressure.  CMP checked at visit today.  Return in 1 month.  Call sooner if concerns arise.       Relevant Orders   Comp Met (CMET)     Other   Morbid obesity ([facility]) 246 lbs BMI=34.6     Follow up plan: Return in about 1 month (around 05/09/2023) for BP Check.

## 2023-04-09 NOTE — Assessment & Plan Note (Signed)
Chronic.  Not well controlled.  Patient has not been consistent with taking medications.  States she believes it has been 3 days since the last time she took her medication.  Discussed importance of taking medications daily and long term effects of uncontrolled blood pressure.  CMP checked at visit today.  Return in 1 month.  Call sooner if concerns arise.

## 2023-04-10 LAB — COMPREHENSIVE METABOLIC PANEL
ALT: 4 IU/L (ref 0–32)
AST: 12 IU/L (ref 0–40)
Albumin: 4.3 g/dL (ref 3.9–4.9)
Alkaline Phosphatase: 70 IU/L (ref 44–121)
BUN/Creatinine Ratio: 14 (ref 9–23)
BUN: 11 mg/dL (ref 6–20)
Bilirubin Total: 0.5 mg/dL (ref 0.0–1.2)
CO2: 23 mmol/L (ref 20–29)
Calcium: 9.4 mg/dL (ref 8.7–10.2)
Chloride: 107 mmol/L — ABNORMAL HIGH (ref 96–106)
Creatinine, Ser: 0.78 mg/dL (ref 0.57–1.00)
Globulin, Total: 2.2 g/dL (ref 1.5–4.5)
Glucose: 77 mg/dL (ref 70–99)
Potassium: 4 mmol/L (ref 3.5–5.2)
Sodium: 142 mmol/L (ref 134–144)
Total Protein: 6.5 g/dL (ref 6.0–8.5)
eGFR: 100 mL/min/{1.73_m2} (ref >59)

## 2023-04-10 NOTE — Progress Notes (Signed)
Please let patient know that her lab work looks good.  No concerns at this time.  Continue with current medication regimen.  Follow up as discussed.

## 2023-04-16 DIAGNOSIS — I10 Essential (primary) hypertension: Secondary | ICD-10-CM | POA: Diagnosis not present

## 2023-04-16 DIAGNOSIS — E049 Nontoxic goiter, unspecified: Secondary | ICD-10-CM | POA: Diagnosis not present

## 2023-04-16 DIAGNOSIS — E0590 Thyrotoxicosis, unspecified without thyrotoxic crisis or storm: Secondary | ICD-10-CM | POA: Diagnosis not present

## 2023-04-16 DIAGNOSIS — E6609 Other obesity due to excess calories: Secondary | ICD-10-CM | POA: Diagnosis not present

## 2023-04-23 ENCOUNTER — Encounter: Payer: Self-pay | Admitting: Physician Assistant

## 2023-04-23 ENCOUNTER — Ambulatory Visit (INDEPENDENT_AMBULATORY_CARE_PROVIDER_SITE_OTHER): Payer: Medicaid Other | Admitting: Physician Assistant

## 2023-04-23 VITALS — BP 122/82 | HR 93 | Temp 98.8°F | Wt 173.6 lb

## 2023-04-23 DIAGNOSIS — I159 Secondary hypertension, unspecified: Secondary | ICD-10-CM | POA: Diagnosis not present

## 2023-04-23 DIAGNOSIS — E0500 Thyrotoxicosis with diffuse goiter without thyrotoxic crisis or storm: Secondary | ICD-10-CM | POA: Diagnosis not present

## 2023-04-23 NOTE — Assessment & Plan Note (Signed)
Chronic, historic condition She is taking her medications more consistently and denies side effects She is taking Amlodipine 10 mg PO every day, and Valsartan 320 mg PO every day - appears to be tolerating well  Her BP in office is in goal Continue current regimen  Follow up in 6 months or sooner if concerns arise

## 2023-04-23 NOTE — Progress Notes (Signed)
Established Patient Office Visit    Patient: Jill [name]   DOB: August 23, 1986   36 y.o. Female  MRN: [id number] Visit Date: 04/23/2023  Today's healthcare provider: Oswaldo Conroy Jamoni Hewes, PA-C  Introduced myself to the patient as a Secondary school teacher and provided education on APPs in clinical practice.    Chief Complaint  Patient presents with   Hypertension   Subjective    HPI   Hypertension: - Medications: Amlodipine 10 mg PO every day, Valsartan 320 mg PO every day  - Compliance: She reports she has been taking her medications every day  - Checking BP at home: Not checking at home  - Denies any SOB, CP, vision changes, LE edema, medication SEs, or symptoms of hypotension She reports she has medication available- refills not needed right now  She reports that her thyroid doctor is closing their office and will need a referral to new Endocrinology office   Medications: Outpatient Medications Prior to Visit  Medication Sig   amLODipine (NORVASC) 10 MG tablet Take 1 tablet (10 mg total) by mouth daily.   etonogestrel (NEXPLANON) 68 MG IMPL implant 1 each by Subdermal route once.   methimazole (TAPAZOLE) 5 MG tablet Take 5 mg by mouth daily.   valsartan (DIOVAN) 320 MG tablet Take 1 tablet (320 mg total) by mouth daily.   [DISCONTINUED] methimazole (TAPAZOLE) 10 MG tablet Take 20 mg by mouth every morning.   No facility-administered medications prior to visit.    Review of Systems  Eyes:  Negative for photophobia and visual disturbance.  Respiratory:  Negative for chest tightness and shortness of breath.   Cardiovascular:  Negative for chest pain, palpitations and leg swelling.  Neurological:  Negative for dizziness, facial asymmetry and headaches.       Objective    BP 122/82   Pulse 93   Temp 98.8 F (37.1 C) (Oral)   Wt 173 lb 9.6 oz (78.7 kg)   LMP 03/25/2023 (Approximate)   SpO2 99%   BMI 29.21 kg/m    Physical Exam Vitals reviewed.  Constitutional:       General: She is awake.     Appearance: Normal appearance. She is well-developed and well-groomed.  HENT:     Head: Normocephalic and atraumatic.  Cardiovascular:     Rate and Rhythm: Normal rate and regular rhythm.     Pulses: Normal pulses.          Radial pulses are 2+ on the right side and 2+ on the left side.     Heart sounds: Normal heart sounds. No murmur heard.    No friction rub. No gallop.  Musculoskeletal:     Right lower leg: No edema.     Left lower leg: No edema.  Neurological:     Mental Status: She is alert.  Psychiatric:        Behavior: Behavior is cooperative.       No results found for any visits on 04/23/23.  Assessment & Plan      Return in about 6 months (around 10/24/2023) for HTN, thyroid.       Problem List Items Addressed This Visit       Cardiovascular and Mediastinum   Hypertension 137/91 on 04/27/21 - Primary    Chronic, historic condition She is taking her medications more consistently and denies side effects She is taking Amlodipine 10 mg PO every day, and Valsartan 320 mg PO every day - appears to be tolerating well  Her BP in office is in goal Continue current regimen  Follow up in 6 months or sooner if concerns arise         Endocrine   Graves disease   Relevant Medications   methimazole (TAPAZOLE) 5 MG tablet   Other Relevant Orders   Ambulatory referral to Endocrinology     Return in about 6 months (around 10/24/2023) for HTN, thyroid.   I, Shakeria Robinette E Reuven Braver, PA-C, have reviewed all documentation for this visit. The documentation on 04/23/23 for the exam, diagnosis, procedures, and orders are all accurate and complete.   Jacquelin Hawking, MHS, PA-C [facility]

## 2023-06-14 ENCOUNTER — Other Ambulatory Visit: Payer: Self-pay | Admitting: Physician Assistant

## 2023-06-14 ENCOUNTER — Telehealth: Payer: Self-pay | Admitting: Nurse Practitioner

## 2023-06-14 DIAGNOSIS — E0500 Thyrotoxicosis with diffuse goiter without thyrotoxic crisis or storm: Secondary | ICD-10-CM

## 2023-06-14 NOTE — Telephone Encounter (Signed)
Copied from CRM [id number]. Topic: General - Inquiry >> Jun 14, 2023 12:47 PM De Blanch wrote: Reason for CRM:Pt stated she is calling to f/u on a request for a new referral to endocrinology because [facility] clinic no longer accepts her insurance ([place] [employer]) .   Please advise.

## 2023-07-02 ENCOUNTER — Other Ambulatory Visit: Payer: Self-pay | Admitting: Nurse Practitioner

## 2023-07-02 NOTE — Telephone Encounter (Signed)
Medication Refill - Medication: methimazole (TAPAZOLE) 5 MG tablet   Has 2 weeks left   Has the patient contacted their pharmacy? Yes.  (Agent: If no, request that the patient contact the pharmacy for the refill. If patient does not wish to contact the pharmacy document the reason why and proceed with request.) (Agent: If yes, when and what did the pharmacy advise?)  Preferred Pharmacy (with phone number or street name):  [id number] [place], [place] - [address] AT [facility] [address] [facility] [facility]  [address] [place] [address]  Phone: [phone] Fax: [phone]   Has the patient been seen for an appointment in the last year OR does the patient have an upcoming appointment? Yes.    Agent: Please be advised that RX refills may take up to 3 business days. We ask that you follow-up with your pharmacy.

## 2023-07-04 MED ORDER — METHIMAZOLE 5 MG PO TABS: 5.0000 mg | ORAL_TABLET | Freq: Every day | ORAL | 0 refills | Status: DC

## 2023-07-04 NOTE — Telephone Encounter (Signed)
This was refused because it's a duplicate request for the Diovan 320 mg.  On 07/04/2023 #90, 0 refills was given.

## 2023-07-04 NOTE — Telephone Encounter (Signed)
Requested Prescriptions  Pending Prescriptions Disp Refills   valsartan (DIOVAN) 320 MG tablet [Pharmacy Med Name: VALSARTAN 320MG  TABLETS] 90 tablet 0    Sig: TAKE 1 TABLET(320 MG) BY MOUTH DAILY     Cardiovascular:  Angiotensin Receptor Blockers Passed - 07/02/2023  3:21 PM      Passed - Cr in normal range and within 180 days    Creatinine, Ser  Date Value Ref Range Status  04/09/2023 0.78 0.57 - 1.00 mg/dL Final         Passed - K in normal range and within 180 days    Potassium  Date Value Ref Range Status  04/09/2023 4.0 3.5 - 5.2 mmol/L Final         Passed - Patient is not pregnant      Passed - Last BP in normal range    BP Readings from Last 1 Encounters:  04/23/23 122/82         Passed - Valid encounter within last 6 months    Recent Outpatient Visits           2 months ago Secondary hypertension   [place] [facility] Mecum, Oswaldo Conroy, PA-C   2 months ago Secondary hypertension   [place] [facility] Larae Grooms, NP   9 months ago Annual physical exam   [place] [facility] Larae Grooms, NP   1 year ago Secondary hypertension   [place] [facility] Larae Grooms, NP   1 year ago Secondary hypertension   [place] [facility] Larae Grooms, NP       Future Appointments             In 3 months Larae Grooms, NP [place] [facility]

## 2023-07-04 NOTE — Telephone Encounter (Signed)
Requested medication (s) are due for refill today:?  Requested medication (s) are on the active medication list: historical med   Last refill:  04/23/23  Future visit scheduled: yes  Notes to clinic:  historical provider   Requested Prescriptions  Pending Prescriptions Disp Refills   methimazole (TAPAZOLE) 5 MG tablet      Sig: Take 1 tablet (5 mg total) by mouth daily.     Not Delegated - Endocrinology:  Hyperthyroid Agents Failed - 07/02/2023  3:29 PM      Failed - This refill cannot be delegated      Failed - TSH in normal range and within 180 days    TSH  Date Value Ref Range Status  09/21/2022 0.592 0.450 - 4.500 uIU/mL Final         Failed - T3 Total in normal range and within 180 days    No results found for: "T3TOTAL", "T3FREE"       Failed - T4 free in normal range and within 180 days    Free T4  Date Value Ref Range Status  09/21/2022 1.44 0.82 - 1.77 ng/dL Final         Passed - Valid encounter within last 6 months    Recent Outpatient Visits           2 months ago Secondary hypertension   [place] [facility] Mecum, Oswaldo Conroy, PA-C   2 months ago Secondary hypertension   [place] [facility] Larae Grooms, NP   9 months ago Annual physical exam   [place] [facility] Larae Grooms, NP   1 year ago Secondary hypertension   [place] [facility] Larae Grooms, NP   1 year ago Secondary hypertension   [place] [facility] Larae Grooms, NP       Future Appointments             In 3 months Larae Grooms, NP [facility]

## 2023-10-01 ENCOUNTER — Other Ambulatory Visit: Payer: Self-pay

## 2023-10-01 DIAGNOSIS — E0500 Thyrotoxicosis with diffuse goiter without thyrotoxic crisis or storm: Secondary | ICD-10-CM

## 2023-10-08 ENCOUNTER — Other Ambulatory Visit: Payer: Medicaid Other

## 2023-10-15 ENCOUNTER — Ambulatory Visit: Payer: Self-pay | Admitting: "Endocrinology

## 2023-10-22 ENCOUNTER — Ambulatory Visit (INDEPENDENT_AMBULATORY_CARE_PROVIDER_SITE_OTHER): Payer: Medicaid Other | Admitting: Nurse Practitioner

## 2023-10-22 ENCOUNTER — Encounter: Payer: Self-pay | Admitting: Nurse Practitioner

## 2023-10-22 VITALS — BP 106/73 | HR 73 | Temp 97.5°F | Ht 65.0 in | Wt 170.8 lb

## 2023-10-22 DIAGNOSIS — Z0000 Encounter for general adult medical examination without abnormal findings: Secondary | ICD-10-CM

## 2023-10-22 DIAGNOSIS — Z136 Encounter for screening for cardiovascular disorders: Secondary | ICD-10-CM | POA: Diagnosis not present

## 2023-10-22 DIAGNOSIS — E0500 Thyrotoxicosis with diffuse goiter without thyrotoxic crisis or storm: Secondary | ICD-10-CM

## 2023-10-22 DIAGNOSIS — I10 Essential (primary) hypertension: Secondary | ICD-10-CM | POA: Diagnosis not present

## 2023-10-22 LAB — URINALYSIS, ROUTINE W REFLEX MICROSCOPIC
Bilirubin, UA: NEGATIVE
Glucose, UA: NEGATIVE
Ketones, UA: NEGATIVE
Nitrite, UA: NEGATIVE
Protein,UA: NEGATIVE
Specific Gravity, UA: 1.025 (ref 1.005–1.030)
Urobilinogen, Ur: 0.2 mg/dL (ref 0.2–1.0)
pH, UA: 5.5 (ref 5.0–7.5)

## 2023-10-22 LAB — MICROSCOPIC EXAMINATION: Bacteria, UA: NONE SEEN

## 2023-10-22 MED ORDER — VALSARTAN 320 MG PO TABS: 320.0000 mg | ORAL_TABLET | Freq: Every day | ORAL | 1 refills | Status: DC

## 2023-10-22 MED ORDER — METHIMAZOLE 5 MG PO TABS: 5.0000 mg | ORAL_TABLET | Freq: Every day | ORAL | 0 refills | Status: DC

## 2023-10-22 MED ORDER — AMLODIPINE BESYLATE 10 MG PO TABS: 10.0000 mg | ORAL_TABLET | Freq: Every day | ORAL | 1 refills | Status: DC

## 2023-10-22 NOTE — Assessment & Plan Note (Signed)
Chronic. Has not been followed by Endocrinology recently due to previous provider closing.  Has an appt with new Endo in February.  Labs ordered today.  Refilled Methimazole until she can see Endo.

## 2023-10-22 NOTE — Assessment & Plan Note (Signed)
Chronic.  Controlled.  Continue with current medication regimen of Amlodipine and Valsartan.  Refill sent today.  Labs ordered today.  Return to clinic in 6 months for reevaluation.  Call sooner if concerns arise.

## 2023-10-22 NOTE — Progress Notes (Signed)
BP 106/73 (BP Location: Left Arm, Patient Position: Sitting, Cuff Size: Normal)   Pulse 73   Temp (!) 97.5 F (36.4 C) (Oral)   Ht 5\' 5"  (1.651 m)   Wt 170 lb 12.8 oz (77.5 kg)   SpO2 100%   BMI 28.42 kg/m    Subjective:    Patient ID: Jill [name], female    DOB: Sep 13, 1986, 37 y.o.   MRN: [id number]  HPI: Jill [name] is a 38 y.o. female presenting on 10/22/2023 for comprehensive medical examination. Current medical complaints include:none  She currently lives with: Menopausal Symptoms: no  HYPERTENSION Hypertension status: controlled  Satisfied with current treatment? no Duration of hypertension: years BP monitoring frequency:  not checking BP range:  BP medication side effects:  no Medication compliance: excellent compliance Previous BP meds:amlodipine and valsartan Aspirin: no Recurrent headaches: no Visual changes: no Palpitations: no Dyspnea: no Chest pain: no Lower extremity edema: no Dizzy/lightheaded: no   Patient states she has not been seeing Dr. Kerrie Pleasure.  She has an appt with [facility] in [place] in February.     Depression Screen done today and results listed below:     10/22/2023    1:58 PM 04/23/2023    2:11 PM 09/21/2022    1:27 PM 06/22/2022    1:50 PM 05/25/2022   11:13 AM  Depression screen PHQ 2/9  Decreased Interest 0 0 0 0 0  Down, Depressed, Hopeless 0 0 0 0 0  PHQ - 2 Score 0 0 0 0 0  Altered sleeping 0 0 0 0 0  Tired, decreased energy 0 0 0 0 0  Change in appetite 0 0 0 0 0  Feeling bad or failure about yourself  0 0 0 0 0  Trouble concentrating 0 0 0 0 0  Moving slowly or fidgety/restless 0 0 0 0 0  Suicidal thoughts 0 0 0 0 0  PHQ-9 Score 0 0 0 0 0  Difficult doing work/chores  Not difficult at all Not difficult at all Not difficult at all Not difficult at all    The patient does not have a history of falls. I did complete a risk assessment for falls. A plan of care for falls was documented.   Past Medical  History:  Past Medical History:  Diagnosis Date   History of chlamydia 01/22/2021   History of gonorrhea 04/11/2020   Hypertension 03/22/2016    Surgical History:  Past Surgical History:  Procedure Laterality Date   DIAGNOSTIC LAPAROSCOPY WITH REMOVAL OF ECTOPIC PREGNANCY Left 03/21/2021   Procedure: DIAGNOSTIC LAPAROSCOPY WITH REMOVAL OF ECTOPIC PREGNANCY;  Surgeon: Hildred Laser, MD;  Location: [facility] ORS;  Service: Gynecology;  Laterality: Left;   NO PAST SURGERIES      Medications:  Current Outpatient Medications on File Prior to Visit  Medication Sig   etonogestrel (NEXPLANON) 68 MG IMPL implant 1 each by Subdermal route once.   No current facility-administered medications on file prior to visit.    Allergies:  No Known Allergies  Social History:  Social History   Socioeconomic History   Marital status: Single    Spouse name: Not on file   Number of children: Not on file   Years of education: Not on file   Highest education level: Not on file  Occupational History   Not on file  Tobacco Use   Smoking status: Former    Types: Cigarettes    Start date: 10/23/2010   Smokeless tobacco: Never   Tobacco comments:  Stopped about 1 month ago  Vaping Use   Vaping status: Never Used  Substance and Sexual Activity   Alcohol use: Not Currently    Alcohol/week: 1.0 standard drink of alcohol    Types: 1 Standard drinks or equivalent per week   Drug use: Never   Sexual activity: Yes    Partners: Male    Birth control/protection: Implant  Other Topics Concern   Not on file  Social History Narrative   Not on file   Social Drivers of Health   Financial Resource Strain: Not on file  Food Insecurity: Not on file  Transportation Needs: Not on file  Physical Activity: Not on file  Stress: Not on file  Social Connections: Not on file  Intimate Partner Violence: Not At Risk (04/27/2021)   Humiliation, Afraid, Rape, and Kick questionnaire    Fear of Current or Ex-Partner: No     Emotionally Abused: No    Physically Abused: No    Sexually Abused: No   Social History   Tobacco Use  Smoking Status Former   Types: Cigarettes   Start date: 10/23/2010  Smokeless Tobacco Never  Tobacco Comments   Stopped about 1 month ago   Social History   Substance and Sexual Activity  Alcohol Use Not Currently   Alcohol/week: 1.0 standard drink of alcohol   Types: 1 Standard drinks or equivalent per week    Family History:  Family History  Problem Relation Age of Onset   Hypertension Father    Diabetes Father    Hypertension Mother    Diabetes Mother     Past medical history, surgical history, medications, allergies, family history and social history reviewed with patient today and changes made to appropriate areas of the chart.   Review of Systems  Eyes:  Negative for blurred vision and double vision.  Respiratory:  Negative for shortness of breath.   Cardiovascular:  Negative for chest pain, palpitations and leg swelling.  Neurological:  Negative for dizziness and headaches.   All other ROS negative except what is listed above and in the HPI.      Objective:    BP 106/73 (BP Location: Left Arm, Patient Position: Sitting, Cuff Size: Normal)   Pulse 73   Temp (!) 97.5 F (36.4 C) (Oral)   Ht 5\' 5"  (1.651 m)   Wt 170 lb 12.8 oz (77.5 kg)   SpO2 100%   BMI 28.42 kg/m   Wt Readings from Last 3 Encounters:  10/22/23 170 lb 12.8 oz (77.5 kg)  04/23/23 173 lb 9.6 oz (78.7 kg)  04/09/23 180 lb 3.2 oz (81.7 kg)    Physical Exam Vitals and nursing note reviewed.  Constitutional:      General: She is awake. She is not in acute distress.    Appearance: Normal appearance. She is well-developed. She is not ill-appearing.  HENT:     Head: Normocephalic and atraumatic.     Right Ear: Hearing, tympanic membrane, ear canal and external ear normal. No drainage.     Left Ear: Hearing, tympanic membrane, ear canal and external ear normal. No drainage.     Nose:  Nose normal.     Right Sinus: No maxillary sinus tenderness or frontal sinus tenderness.     Left Sinus: No maxillary sinus tenderness or frontal sinus tenderness.     Mouth/Throat:     Mouth: Mucous membranes are moist.     Pharynx: Oropharynx is clear. Uvula midline. No pharyngeal swelling, oropharyngeal exudate or  posterior oropharyngeal erythema.  Eyes:     General: Lids are normal.        Right eye: No discharge.        Left eye: No discharge.     Extraocular Movements: Extraocular movements intact.     Conjunctiva/sclera: Conjunctivae normal.     Pupils: Pupils are equal, round, and reactive to light.     Visual Fields: Right eye visual fields normal and left eye visual fields normal.  Neck:     Thyroid: No thyromegaly.     Vascular: No carotid bruit.     Trachea: Trachea normal.  Cardiovascular:     Rate and Rhythm: Normal rate and regular rhythm.     Heart sounds: Normal heart sounds. No murmur heard.    No gallop.  Pulmonary:     Effort: Pulmonary effort is normal. No accessory muscle usage or respiratory distress.     Breath sounds: Normal breath sounds.  Chest:  Breasts:    Right: Normal.     Left: Normal.  Abdominal:     General: Bowel sounds are normal.     Palpations: Abdomen is soft. There is no hepatomegaly or splenomegaly.     Tenderness: There is no abdominal tenderness.  Musculoskeletal:        General: Normal range of motion.     Cervical back: Normal range of motion and neck supple.     Right lower leg: No edema.     Left lower leg: No edema.  Lymphadenopathy:     Head:     Right side of head: No submental, submandibular, tonsillar, preauricular or posterior auricular adenopathy.     Left side of head: No submental, submandibular, tonsillar, preauricular or posterior auricular adenopathy.     Cervical: No cervical adenopathy.     Upper Body:     Right upper body: No supraclavicular, axillary or pectoral adenopathy.     Left upper body: No  supraclavicular, axillary or pectoral adenopathy.  Skin:    General: Skin is warm and dry.     Capillary Refill: Capillary refill takes less than 2 seconds.     Findings: No rash.  Neurological:     Mental Status: She is alert and oriented to person, place, and time.     Gait: Gait is intact.     Deep Tendon Reflexes: Reflexes are normal and symmetric.     Reflex Scores:      Brachioradialis reflexes are 2+ on the right side and 2+ on the left side.      Patellar reflexes are 2+ on the right side and 2+ on the left side. Psychiatric:        Attention and Perception: Attention normal.        Mood and Affect: Mood normal.        Speech: Speech normal.        Behavior: Behavior normal. Behavior is cooperative.        Thought Content: Thought content normal.        Judgment: Judgment normal.     Results for orders placed or performed in visit on 04/09/23  Comp Met (CMET)   Collection Time: 04/09/23  2:08 PM  Result Value Ref Range   Glucose 77 70 - 99 mg/dL   BUN 11 6 - 20 mg/dL   Creatinine, Ser 2.44 0.57 - 1.00 mg/dL   eGFR 010 >27 OZ/DGU/4.40   BUN/Creatinine Ratio 14 9 - 23   Sodium 142 134 - 144  mmol/L   Potassium 4.0 3.5 - 5.2 mmol/L   Chloride 107 (H) 96 - 106 mmol/L   CO2 23 20 - 29 mmol/L   Calcium 9.4 8.7 - 10.2 mg/dL   Total Protein 6.5 6.0 - 8.5 g/dL   Albumin 4.3 3.9 - 4.9 g/dL   Globulin, Total 2.2 1.5 - 4.5 g/dL   Bilirubin Total 0.5 0.0 - 1.2 mg/dL   Alkaline Phosphatase 70 44 - 121 IU/L   AST 12 0 - 40 IU/L   ALT 4 0 - 32 IU/L      Assessment & Plan:   Problem List Items Addressed This Visit       Cardiovascular and Mediastinum   Hypertension 137/91 on 04/27/21   Chronic.  Controlled.  Continue with current medication regimen of Amlodipine and Valsartan.  Refill sent today.  Labs ordered today.  Return to clinic in 6 months for reevaluation.  Call sooner if concerns arise.        Relevant Medications   amLODipine (NORVASC) 10 MG tablet   valsartan  (DIOVAN) 320 MG tablet     Endocrine   Graves disease   Chronic. Has not been followed by Endocrinology recently due to previous provider closing.  Has an appt with new Endo in February.  Labs ordered today.  Refilled Methimazole until she can see Endo.      Relevant Medications   methimazole (TAPAZOLE) 5 MG tablet   Other Visit Diagnoses       Annual physical exam    -  Primary   Health maintenance reviewed during visit today.  Labs ordered.  Vaccines reviewed. Will get PAP at next visit.   Relevant Orders   CBC with Differential/Platelet   Comprehensive metabolic panel   Lipid panel   TSH   Urinalysis, Routine w reflex microscopic     Screening for ischemic heart disease       Relevant Orders   Lipid panel        Follow up plan: Return in about 6 months (around 04/21/2024) for HTN, HLD, DM2 FU.   LABORATORY TESTING:  - Pap smear: up to date  IMMUNIZATIONS:   - Tdap: Tetanus vaccination status reviewed: last tetanus booster within 10 years. - Influenza: Refused - Pneumovax: Not applicable - Prevnar: Not applicable - HPV: Administered today - Zostavax vaccine: Not applicable  SCREENING: -Mammogram: Not applicable  - Colonoscopy: Not applicable  - Bone Density: Not applicable  -Hearing Test: Not applicable  -Spirometry: Not applicable   PATIENT COUNSELING:   Advised to take 1 mg of folate supplement per day if capable of pregnancy.   Sexuality: Discussed sexually transmitted diseases, partner selection, use of condoms, avoidance of unintended pregnancy  and contraceptive alternatives.   Advised to avoid cigarette smoking.  I discussed with the patient that most people either abstain from alcohol or drink within safe limits (<=14/week and <=4 drinks/occasion for males, <=7/weeks and <= 3 drinks/occasion for females) and that the risk for alcohol disorders and other health effects rises proportionally with the number of drinks per week and how often a drinker  exceeds daily limits.  Discussed cessation/primary prevention of drug use and availability of treatment for abuse.   Diet: Encouraged to adjust caloric intake to maintain  or achieve ideal body weight, to reduce intake of dietary saturated fat and total fat, to limit sodium intake by avoiding high sodium foods and not adding table salt, and to maintain adequate dietary potassium and calcium preferably from fresh  fruits, vegetables, and low-fat dairy products.    stressed the importance of regular exercise  Injury prevention: Discussed safety belts, safety helmets, smoke detector, smoking near bedding or upholstery.   Dental health: Discussed importance of regular tooth brushing, flossing, and dental visits.    NEXT PREVENTATIVE PHYSICAL DUE IN 1 YEAR. Return in about 6 months (around 04/21/2024) for HTN, HLD, DM2 FU.

## 2023-10-23 LAB — COMPREHENSIVE METABOLIC PANEL
ALT: 6 [IU]/L (ref 0–32)
AST: 13 [IU]/L (ref 0–40)
Albumin: 4.1 g/dL (ref 3.9–4.9)
Alkaline Phosphatase: 60 [IU]/L (ref 44–121)
BUN/Creatinine Ratio: 13 (ref 9–23)
BUN: 11 mg/dL (ref 6–20)
Bilirubin Total: 0.5 mg/dL (ref 0.0–1.2)
CO2: 23 mmol/L (ref 20–29)
Calcium: 9.2 mg/dL (ref 8.7–10.2)
Chloride: 107 mmol/L — ABNORMAL HIGH (ref 96–106)
Creatinine, Ser: 0.86 mg/dL (ref 0.57–1.00)
Globulin, Total: 2 g/dL (ref 1.5–4.5)
Glucose: 53 mg/dL — ABNORMAL LOW (ref 70–99)
Potassium: 4 mmol/L (ref 3.5–5.2)
Sodium: 140 mmol/L (ref 134–144)
Total Protein: 6.1 g/dL (ref 6.0–8.5)
eGFR: 89 mL/min/{1.73_m2} (ref >59)

## 2023-10-23 LAB — LIPID PANEL
Chol/HDL Ratio: 2 {ratio} (ref 0.0–4.4)
Cholesterol, Total: 159 mg/dL (ref 100–199)
HDL: 79 mg/dL (ref >39)
LDL Chol Calc (NIH): 71 mg/dL (ref 0–99)
Triglycerides: 39 mg/dL (ref 0–149)
VLDL Cholesterol Cal: 9 mg/dL (ref 5–40)

## 2023-10-23 LAB — CBC WITH DIFFERENTIAL/PLATELET
Basophils Absolute: 0 10*3/uL (ref 0.0–0.2)
Basos: 0 %
EOS (ABSOLUTE): 0.1 10*3/uL (ref 0.0–0.4)
Eos: 1 %
Hematocrit: 38.8 % (ref 34.0–46.6)
Hemoglobin: 12.6 g/dL (ref 11.1–15.9)
Immature Grans (Abs): 0 10*3/uL (ref 0.0–0.1)
Immature Granulocytes: 0 %
Lymphocytes Absolute: 1.6 10*3/uL (ref 0.7–3.1)
Lymphs: 18 %
MCH: 31.7 pg (ref 26.6–33.0)
MCHC: 32.5 g/dL (ref 31.5–35.7)
MCV: 98 fL — ABNORMAL HIGH (ref 79–97)
Monocytes Absolute: 0.5 10*3/uL (ref 0.1–0.9)
Monocytes: 5 %
Neutrophils Absolute: 6.5 10*3/uL (ref 1.4–7.0)
Neutrophils: 76 %
Platelets: 275 10*3/uL (ref 150–450)
RBC: 3.98 x10E6/uL (ref 3.77–5.28)
RDW: 11.7 % (ref 11.7–15.4)
WBC: 8.7 10*3/uL (ref 3.4–10.8)

## 2023-10-23 LAB — TSH: TSH: 0.462 u[IU]/mL (ref 0.450–4.500)

## 2023-10-29 ENCOUNTER — Other Ambulatory Visit: Payer: Self-pay | Admitting: Nurse Practitioner

## 2023-11-01 NOTE — Telephone Encounter (Signed)
 Duplicate request. Rx last ordered 10/22/23 #90, 1RF.  Requested Prescriptions  Pending Prescriptions Disp Refills   valsartan  (DIOVAN ) 320 MG tablet [Pharmacy Med Name: VALSARTAN  320MG  TABLETS] 90 tablet 1    Sig: TAKE 1 TABLET(320 MG) BY MOUTH DAILY     Cardiovascular:  Angiotensin Receptor Blockers Passed - 11/01/2023  9:31 AM      Passed - Cr in normal range and within 180 days    Creatinine, Ser  Date Value Ref Range Status  10/22/2023 0.86 0.57 - 1.00 mg/dL Final         Passed - K in normal range and within 180 days    Potassium  Date Value Ref Range Status  10/22/2023 4.0 3.5 - 5.2 mmol/L Final         Passed - Patient is not pregnant      Passed - Last BP in normal range    BP Readings from Last 1 Encounters:  10/22/23 106/73         Passed - Valid encounter within last 6 months    Recent Outpatient Visits           1 week ago Annual physical exam   [place] [facility] Melvin Pao, NP   6 months ago Secondary hypertension   [place] [facility] Mecum, Rocky BRAVO, PA-C   6 months ago Secondary hypertension   [place] [facility] Melvin Pao, NP   1 year ago Annual physical exam   [place] [facility] Melvin Pao, NP   1 year ago Secondary hypertension   [place] [facility] Melvin Pao, NP       Future Appointments             In 1 month Dartha Ernst, MD [facility] End[facility]ology   In 5 months Melvin Pao, NP  [facility]

## 2023-11-29 ENCOUNTER — Other Ambulatory Visit: Payer: Medicaid Other

## 2023-12-06 ENCOUNTER — Ambulatory Visit: Payer: Self-pay | Admitting: "Endocrinology

## 2024-01-11 ENCOUNTER — Other Ambulatory Visit: Payer: Self-pay | Admitting: Nurse Practitioner

## 2024-01-14 NOTE — Telephone Encounter (Signed)
 Requested Prescriptions  Refused Prescriptions Disp Refills   amLODipine (NORVASC) 10 MG tablet [Pharmacy Med Name: AMLODIPINE BESYLATE 10MG  TABLETS] 90 tablet 1    Sig: TAKE 1 TABLET(10 MG) BY MOUTH DAILY     Cardiovascular: Calcium Channel Blockers 2 Passed - 01/14/2024 10:58 AM      Passed - Last BP in normal range    BP Readings from Last 1 Encounters:  10/22/23 106/73         Passed - Last Heart Rate in normal range    Pulse Readings from Last 1 Encounters:  10/22/23 73         Passed - Valid encounter within last 6 months    Recent Outpatient Visits           2 months ago Annual physical exam   [place] [facility] Larae Grooms, NP   8 months ago Secondary hypertension   [place] [facility] Mecum, Oswaldo Conroy, PA-C   9 months ago Secondary hypertension   [place] [facility] Larae Grooms, NP   1 year ago Annual physical exam   [place] [facility] Larae Grooms, NP   1 year ago Secondary hypertension   [place] [facility] Larae Grooms, NP       Future Appointments             In 1 month Altamese [place], MD [facility]   In 3 months Larae Grooms, NP [facility]             amLODipine (NORVASC) 5 MG tablet [Pharmacy Med Name: AMLODIPINE BESYLATE 5MG  TABLETS] 90 tablet 1    Sig: TAKE 1 TABLET(5 MG) BY MOUTH DAILY     Cardiovascular: Calcium Channel Blockers 2 Passed - 01/14/2024 10:58 AM      Passed - Last BP in normal range    BP Readings from Last 1 Encounters:  10/22/23 106/73         Passed - Last Heart Rate in normal range    Pulse Readings from Last 1 Encounters:  10/22/23 73         Passed - Valid encounter within last 6 months    Recent Outpatient Visits           2 months ago Annual physical exam   [place] [facility] Larae Grooms, NP   8 months ago Secondary  hypertension   [place] [facility] Mecum, Oswaldo Conroy, PA-C   9 months ago Secondary hypertension   [place] [facility] Larae Grooms, NP   1 year ago Annual physical exam   [place] [facility] Larae Grooms, NP   1 year ago Secondary hypertension   [place] [facility] Larae Grooms, NP       Future Appointments             In 1 month Alt[place]e [place], MD [facility]   In 3 months Larae Grooms, NP [place] [facility]

## 2024-02-18 ENCOUNTER — Other Ambulatory Visit

## 2024-02-21 ENCOUNTER — Ambulatory Visit (INDEPENDENT_AMBULATORY_CARE_PROVIDER_SITE_OTHER): Admitting: "Endocrinology

## 2024-02-21 ENCOUNTER — Encounter: Payer: Self-pay | Admitting: "Endocrinology

## 2024-02-21 VITALS — BP 118/84 | Ht 65.0 in | Wt 168.0 lb

## 2024-02-21 DIAGNOSIS — E0500 Thyrotoxicosis with diffuse goiter without thyrotoxic crisis or storm: Secondary | ICD-10-CM

## 2024-02-21 NOTE — Patient Instructions (Signed)
 If you notice any symptoms of worsening fatigue, fever with sore throat, loss of appetite, yellowing of eyes, dark urine, joint pains, sores in the mouth, itchy rash, light colored stools or abdominal pain, please stop the medication and call us  immediately as this can be a serious side effect of the medication.    Please notify us  right away if you become pregnant as this medication can cause side effects to the baby.

## 2024-02-21 NOTE — Progress Notes (Signed)
 Outpatient Endocrinology Note Jorge Newcomer, MD  02/21/24   Jill [name] 1986-02-06 [id number]  Referring Provider: Jerona Mooring, PA-C Primary Care Provider: Aileen Alexanders, NP Subjective  No chief complaint on file.   Assessment & Plan  Diagnoses and all orders for this visit:  Graves disease -     TSH -     T3, free -     T4, free -     TRAb (TSH Receptor Binding Antibody) -     Thyroid  stimulating immunoglobulin    Larelle Arbon is currently taking methimazole  5 mg qd. Patient currently clinically and biochemically euthyroid.  Discussed the etiology for hyperthyroidism. Educated on thyroid  axis.  Recommend the following: Take methimazole  5 mg qd a day. Repeat labs in 3 months or sooner if symptoms of hyper or hypothyroidism develop.  02/21/24 Educated on definitive options of treatment including RAI therapy and surgery. Patient does not want RAI at this time.  Counseled on: -complications of untreated hyperthyroidism including atrial fibrillation, heart failure and osteoporosis -side effects of Methimazole  including but not limited to allergic reaction, rash, bone marrow suppression, liver dysfunction and teratogenic potential -implications in pregnancy and breastfeeding -compliance and follow up needs    Has thyroid  ultrasound in the past, no known thyroid  nodule   I have reviewed current medications, nurse's notes, allergies, vital signs, past medical and surgical history, family medical history, and social history for this encounter. Counseled patient on symptoms, examination findings, lab findings, imaging results, treatment decisions and monitoring and prognosis. The patient understood the recommendations and agrees with the treatment plan. All questions regarding treatment plan were fully answered.   Return in about 3 months (around 05/23/2024) for visit + labs before next visit.   Jorge Newcomer, MD  02/21/24   I have reviewed current  medications, nurse's notes, allergies, vital signs, past medical and surgical history, family medical history, and social history for this encounter. Counseled patient on symptoms, examination findings, lab findings, imaging results, treatment decisions and monitoring and prognosis. The patient understood the recommendations and agrees with the treatment plan. All questions regarding treatment plan were fully answered.   History of Present Illness Jill [name] is a 38 y.o. year old female who presents to our clinic with Grave's disease diagnosed in 2023.    Symptoms suggestive of HYPOTHYROIDISM:  fatigue No weight gain No cold intolerance  No constipation  No  Symptoms suggestive of HYPERTHYROIDISM:  weight loss  No heat intolerance No hyperdefecation  No palpitations  No  Compressive symptoms:  dysphagia  No dysphonia  No positional dyspnea (especially with simultaneous arms elevation)  No  Smokes  No On biotin  No Personal history of head/neck surgery/irradiation  No  Adverse Drug Effects from Methimazole  (MMI): rash No fever No throat pain No arthritis No mouth ulcers No jaundice No loss of appetite No lymphadenopathy No  Grave's Ophthalmopathy Clinical Activity Score: 0/9  Physical Exam  BP 118/84   Ht 5\' 5"  (1.651 m)   Wt 168 lb (76.2 kg)   BMI 27.96 kg/m  Constitutional: well developed, well nourished Head: normocephalic, atraumatic, no exophthalmos Eyes: sclera anicteric, no redness Neck: no thyromegaly, no thyroid  tenderness; no nodules palpated Lungs: normal respiratory effort Neurology: alert and oriented, no fine hand tremor Skin: dry, no appreciable rashes Musculoskeletal: no appreciable defects Psychiatric: normal mood and affect  Allergies No Known Allergies  Current Medications Patient's Medications  New Prescriptions   No medications on file  Previous Medications  AMLODIPINE  (NORVASC ) 10 MG TABLET    Take 1 tablet (10 mg total) by  mouth daily.   ETONOGESTREL  (NEXPLANON ) 68 MG IMPL IMPLANT    1 each by Subdermal route once.   METHIMAZOLE  (TAPAZOLE ) 5 MG TABLET    Take 1 tablet (5 mg total) by mouth daily.   VALSARTAN  (DIOVAN ) 320 MG TABLET    Take 1 tablet (320 mg total) by mouth daily.  Modified Medications   No medications on file  Discontinued Medications   No medications on file    Past Medical History Past Medical History:  Diagnosis Date   History of chlamydia 01/22/2021   History of gonorrhea 04/11/2020   Hypertension 03/22/2016    Past Surgical History Past Surgical History:  Procedure Laterality Date   DIAGNOSTIC LAPAROSCOPY WITH REMOVAL OF ECTOPIC PREGNANCY Left 03/21/2021   Procedure: DIAGNOSTIC LAPAROSCOPY WITH REMOVAL OF ECTOPIC PREGNANCY;  Surgeon: Teresa Fender, MD;  Location: [facility] ORS;  Service: Gynecology;  Laterality: Left;   NO PAST SURGERIES      Family History family history includes Diabetes in her father and mother; Hypertension in her father and mother.  Social History Social History   Socioeconomic History   Marital status: Single    Spouse name: Not on file   Number of children: Not on file   Years of education: Not on file   Highest education level: Not on file  Occupational History   Not on file  Tobacco Use   Smoking status: Former    Types: Cigarettes    Start date: 10/23/2010   Smokeless tobacco: Never   Tobacco comments:    Stopped about 1 month ago  Vaping Use   Vaping status: Never Used  Substance and Sexual Activity   Alcohol use: Not Currently    Alcohol/week: 1.0 standard drink of alcohol    Types: 1 Standard drinks or equivalent per week   Drug use: Never   Sexual activity: Yes    Partners: Male    Birth control/protection: Implant  Other Topics Concern   Not on file  Social History Narrative   Not on file   Social Drivers of Health   Financial Resource Strain: Not on file  Food Insecurity: Not on file  Transportation Needs: Not on file  Physical  Activity: Not on file  Stress: Not on file  Social Connections: Not on file  Intimate Partner Violence: Not At Risk (04/27/2021)   Humiliation, Afraid, Rape, and Kick questionnaire    Fear of Current or Ex-Partner: No    Emotionally Abused: No    Physically Abused: No    Sexually Abused: No    Laboratory Investigations Lab Results  Component Value Date   TSH 0.462 10/22/2023   TSH 0.592 09/21/2022   TSH <0.005 (L) 06/17/2021   FREET4 1.44 09/21/2022     No results found for: "TSI"   No components found for: "TRAB"   Lab Results  Component Value Date   CHOL 159 10/22/2023   Lab Results  Component Value Date   HDL 79 10/22/2023   Lab Results  Component Value Date   LDLCALC 71 10/22/2023   Lab Results  Component Value Date   TRIG 39 10/22/2023   Lab Results  Component Value Date   CHOLHDL 2.0 10/22/2023   Lab Results  Component Value Date   CREATININE 0.86 10/22/2023   No results found for: "GFR"    Component Value Date/Time   NA 140 10/22/2023 1406   K 4.0  10/22/2023 1406   CL 107 (H) 10/22/2023 1406   CO2 23 10/22/2023 1406   GLUCOSE 53 (L) 10/22/2023 1406   GLUCOSE 86 03/21/2021 1946   BUN 11 10/22/2023 1406   CREATININE 0.86 10/22/2023 1406   CALCIUM 9.2 10/22/2023 1406   PROT 6.1 10/22/2023 1406   ALBUMIN 4.1 10/22/2023 1406   AST 13 10/22/2023 1406   ALT 6 10/22/2023 1406   ALKPHOS 60 10/22/2023 1406   BILITOT 0.5 10/22/2023 1406   GFRNONAA >60 03/21/2021 1946   GFRAA 135 03/22/2017 1937      Latest Ref Rng & Units 10/22/2023    2:06 PM 04/09/2023    2:08 PM 09/21/2022    1:36 PM  BMP  Glucose 70 - 99 mg/dL 53  77  80   BUN 6 - 20 mg/dL 11  11  9    Creatinine 0.57 - 1.00 mg/dL 1.61  0.96  0.45   BUN/Creat Ratio 9 - 23 13  14  11    Sodium 134 - 144 mmol/L 140  142  141   Potassium 3.5 - 5.2 mmol/L 4.0  4.0  4.4   Chloride 96 - 106 mmol/L 107  107  103   CO2 20 - 29 mmol/L 23  23  24    Calcium 8.7 - 10.2 mg/dL 9.2  9.4  40.9         Component Value Date/Time   WBC 8.7 10/22/2023 1406   WBC 8.0 03/21/2021 1946   RBC 3.98 10/22/2023 1406   RBC 3.95 03/21/2021 1946   HGB 12.6 10/22/2023 1406   HCT 38.8 10/22/2023 1406   PLT 275 10/22/2023 1406   MCV 98 (H) 10/22/2023 1406   MCH 31.7 10/22/2023 1406   MCH 31.1 03/21/2021 1946   MCHC 32.5 10/22/2023 1406   MCHC 34.0 03/21/2021 1946   RDW 11.7 10/22/2023 1406   LYMPHSABS 1.6 10/22/2023 1406   MONOABS 0.7 03/21/2021 1946   EOSABS 0.1 10/22/2023 1406   BASOSABS 0.0 10/22/2023 1406      Parts of this note may have been dictated using voice recognition software. There may be variances in spelling and vocabulary which are unintentional. Not all errors are proofread. Please notify the [facility] if any discrepancies are noted or if the meaning of any statement is not clear.

## 2024-02-22 ENCOUNTER — Other Ambulatory Visit: Payer: Self-pay | Admitting: "Endocrinology

## 2024-02-22 DIAGNOSIS — E0500 Thyrotoxicosis with diffuse goiter without thyrotoxic crisis or storm: Secondary | ICD-10-CM

## 2024-02-26 LAB — TSH: TSH: 0.43 m[IU]/L

## 2024-02-26 LAB — T4, FREE: Free T4: 1.3 ng/dL (ref 0.8–1.8)

## 2024-02-26 LAB — THYROID STIMULATING IMMUNOGLOBULIN: TSI: <89 %{baseline} (ref <140)

## 2024-02-26 LAB — TRAB (TSH RECEPTOR BINDING ANTIBODY): TRAB: <1 IU/L (ref <=2.00)

## 2024-02-26 LAB — T3, FREE: T3, Free: 2.9 pg/mL (ref 2.3–4.2)

## 2024-03-13 DIAGNOSIS — M545 Low back pain, unspecified: Secondary | ICD-10-CM | POA: Diagnosis not present

## 2024-03-28 ENCOUNTER — Other Ambulatory Visit: Payer: Self-pay

## 2024-03-30 ENCOUNTER — Other Ambulatory Visit: Payer: Self-pay | Admitting: Nurse Practitioner

## 2024-03-31 MED ORDER — METHIMAZOLE 5 MG PO TABS: 5.0000 mg | ORAL_TABLET | Freq: Every day | ORAL | 0 refills | Status: DC

## 2024-04-01 NOTE — Telephone Encounter (Signed)
 Requested Prescriptions  Pending Prescriptions Disp Refills   valsartan  (DIOVAN ) 320 MG tablet [Pharmacy Med Name: VALSARTAN  320MG  TABLETS] 90 tablet 1    Sig: TAKE 1 TABLET(320 MG) BY MOUTH DAILY     Cardiovascular:  Angiotensin Receptor Blockers Failed - 04/01/2024  9:10 AM      Failed - Valid encounter within last 6 months    Recent Outpatient Visits   None     Future Appointments             In 3 weeks Jill Alexanders, NP [place] [facility]            Passed - Cr in normal range and within 180 days    Creatinine, Ser  Date Value Ref Range Status  10/22/2023 0.86 0.57 - 1.00 mg/dL Final         Passed - K in normal range and within 180 days    Potassium  Date Value Ref Range Status  10/22/2023 4.0 3.5 - 5.2 mmol/L Final         Passed - Patient is not pregnant      Passed - Last BP in normal range    BP Readings from Last 1 Encounters:  02/21/24 118/84

## 2024-04-04 ENCOUNTER — Other Ambulatory Visit: Payer: Self-pay | Admitting: Nurse Practitioner

## 2024-04-07 NOTE — Telephone Encounter (Signed)
 Requested Prescriptions  Pending Prescriptions Disp Refills   amLODipine  (NORVASC ) 10 MG tablet [Pharmacy Med Name: AMLODIPINE  BESYLATE 10MG TABLETS] 90 tablet 0    Sig: TAKE 1 TABLET(10 MG) BY MOUTH DAILY     Cardiovascular: Calcium Channel Blockers 2 Failed - 04/07/2024  4:52 PM      Failed - Valid encounter within last 6 months    Recent Outpatient Visits   None     Future Appointments             In 2 weeks Aileen Alexanders, NP [place] [facility]            Passed - Last BP in normal range    BP Readings from Last 1 Encounters:  02/21/24 118/84         Passed - Last Heart Rate in normal range    Pulse Readings from Last 1 Encounters:  10/22/23 73

## 2024-04-24 ENCOUNTER — Ambulatory Visit: Payer: Self-pay | Admitting: Nurse Practitioner

## 2024-05-13 ENCOUNTER — Ambulatory Visit

## 2024-05-13 ENCOUNTER — Ambulatory Visit: Admitting: Nurse Practitioner

## 2024-05-13 ENCOUNTER — Ambulatory Visit: Admitting: Family Medicine

## 2024-05-13 ENCOUNTER — Encounter: Payer: Self-pay | Admitting: Nurse Practitioner

## 2024-05-13 ENCOUNTER — Encounter: Payer: Self-pay | Admitting: Family Medicine

## 2024-05-13 VITALS — BP 115/75 | HR 66 | Ht 65.0 in | Wt 165.4 lb

## 2024-05-13 VITALS — BP 106/69 | HR 78 | Temp 98.0°F | Ht 65.0 in | Wt 163.2 lb

## 2024-05-13 DIAGNOSIS — Z113 Encounter for screening for infections with a predominantly sexual mode of transmission: Secondary | ICD-10-CM | POA: Diagnosis not present

## 2024-05-13 DIAGNOSIS — Z3009 Encounter for other general counseling and advice on contraception: Secondary | ICD-10-CM

## 2024-05-13 DIAGNOSIS — Z309 Encounter for contraceptive management, unspecified: Secondary | ICD-10-CM | POA: Diagnosis not present

## 2024-05-13 DIAGNOSIS — I10 Essential (primary) hypertension: Secondary | ICD-10-CM | POA: Diagnosis not present

## 2024-05-13 DIAGNOSIS — Z124 Encounter for screening for malignant neoplasm of cervix: Secondary | ICD-10-CM | POA: Diagnosis not present

## 2024-05-13 DIAGNOSIS — A599 Trichomoniasis, unspecified: Secondary | ICD-10-CM

## 2024-05-13 LAB — WET PREP FOR TRICH, YEAST, CLUE
Clue Cell Exam: POSITIVE — AB
Trichomonas Exam: POSITIVE — AB
Yeast Exam: NEGATIVE

## 2024-05-13 LAB — HM HIV SCREENING LAB: HM HIV Screening: NEGATIVE

## 2024-05-13 MED ORDER — AMLODIPINE BESYLATE 10 MG PO TABS: 10.0000 mg | ORAL_TABLET | Freq: Every day | ORAL | 1 refills | Status: DC

## 2024-05-13 MED ORDER — METRONIDAZOLE 500 MG PO TABS: 500.0000 mg | ORAL_TABLET | Freq: Two times a day (BID) | ORAL | Status: AC

## 2024-05-13 MED ORDER — VALSARTAN 320 MG PO TABS: 320.0000 mg | ORAL_TABLET | Freq: Every day | ORAL | 1 refills | Status: DC

## 2024-05-13 NOTE — Progress Notes (Unsigned)
   BP 106/69   Pulse 78   Temp 98 F (36.7 C) (Oral)   Ht 5' 5 (1.651 m)   Wt 163 lb 3.2 oz (74 kg)   BMI 27.16 kg/m    Subjective:    Patient ID: Jamirah Zelaya, female    DOB: 02-04-1986, 38 y.o.   MRN: [id number]  HPI: Rhian Asebedo is a 38 y.o. female  Chief Complaint  Patient presents with   Hypertension   HYPERTENSION Hypertension status: controlled  Satisfied with current treatment? no Duration of hypertension: years BP monitoring frequency:  not checking BP range:  BP medication side effects:  no Medication compliance: excellent compliance Previous BP meds:amlodipine  and valsartan  Aspirin: no Recurrent headaches: no Visual changes: no Palpitations: no Dyspnea: no Chest pain: no Lower extremity edema: no Dizzy/lightheaded: no  Patient states she hurt her back in the gym about a month ago.  States she couldn't bend down or do anything.  She was treated with two medications but not sure what they were.  When she over does it or lifts something at work it hurts her still.    Relevant past medical, surgical, family and social history reviewed and updated as indicated. Interim medical history since our last visit reviewed. Allergies and medications reviewed and updated.  Review of Systems  Per HPI unless specifically indicated above     Objective:    BP 106/69   Pulse 78   Temp 98 F (36.7 C) (Oral)   Ht 5' 5 (1.651 m)   Wt 163 lb 3.2 oz (74 kg)   BMI 27.16 kg/m   Wt Readings from Last 3 Encounters:  05/13/24 163 lb 3.2 oz (74 kg)  05/13/24 165 lb 6.4 oz (75 kg)  02/21/24 168 lb (76.2 kg)    Physical Exam  Results for orders placed or performed in visit on 05/13/24  WET PREP FOR TRICH, YEAST, CLUE   Collection Time: 05/13/24  3:01 PM  Result Value Ref Range   Trichomonas Exam Positive (A) Negative   Yeast Exam Negative Negative   Clue Cell Exam Positive (A) Negative      Assessment & Plan:   Problem List Items Addressed This Visit    None    Follow up plan: No follow-ups on file.

## 2024-05-13 NOTE — Progress Notes (Signed)
 [employer] [facility] DEPART[facility] [address] [place] [address] Main phone: [phone]  Family Planning Visit - Repeat Yearly Visit  Subjective:  Jill [name] is a 38 y.o. [id number]  being seen today for Nexplanon  replacement, however she has one more year until due (placed July 2022). Patient does not want a pregnancy in the next year.   Patient reports they are looking for a method with the following characteristics:  To continue Nexplanon , due for replacement in July, 2026  Patient has the following medical problems:  Patient Active Problem List   Diagnosis Date Noted   Graves disease 09/21/2022   Rh negative state in antepartum period 03/21/2021   Miscarriage of left tubal ectopic pregnancy 03/21/2021   Nexplanon  in place 03/21/2021   History of chlamydia 01/22/2021   History of gonorrhea 04/11/2020   Morbid obesity (HCC) 246 lbs BMI=34.6 03/12/2020   Hypertension 137/91 on 04/27/21 03/22/2016   Chief Complaint  Patient presents with   Acute Visit    Pt is here for Nexplanon  removal and reinsertion   HPI Patient thought was due for Nexplanon  replacement, but it was placed in July 2022 so she has another full year. She is due for a pap test, last was normal in 2019 and she has not had another since. Also desires STI testing. Only vaginal complaint today is itching. Denies discharge, odor, pain/discomfort.  Had ectopic pregnancy in 2022 with Nexplanon  in place. No pregnancy symptoms/concern today.  Review of Systems  All other systems reviewed and are negative.  See flowsheet for further details and programmatic requirements Hyperlink available at the top of the signed note in blue.  Flow sheet content below:  Pregnancy Intention Screening Does the patient want to become pregnant in the next year?: No Does the patient's partner want to become pregnant in the next year?: No Would the patient like to discuss  contraceptive options today?: Yes (want same BCM) Results Follow up Password: banana Contraception History Past methods of contraception used by patient:: Hormonal Implant, Hormonal Injection Adverse effects associated with Hormonal Injection: none Adverse effects associated with Hormonal Implant: none Sexual History What age did you start your period?: 10 How often do you have your period?: monthly Date of last sex?: 04/29/24 Has the patient had unprotected sex within the last 5 days?: No Do you have sex with men, women, both men and women?: Men only In the past 2 months how many partners have you had sex with?: 1 What ways do you have sex?: Vaginal, Oral Do you douche?: No Have you or your partner ever shot up drugs?: No Have any of your partners used drugs in the past?: No Have you or your partners exchanged money or drugs for sex?: No Risk Factors for Hep B Household, sexual, or needle sharing contact of a person infected with Hep B: No Sexual contact with a person who uses drugs not as prescribed?: No Currently or Ever used drugs not as prescribed: No HIV Positive: No PRep Patient: No Men who have sex with men: No Have Hepatitis C: No History of Incarceration: No History of Homeslessness?: No Anal sex following anal drug use?: No Risk Factors for Hep C Currently using drugs not as prescribed: No Sexual partner(s) currently using drugs as not prescribed: No History of drug use: No HIV Positive: No People with a history of incarceration: No People born between the years of 55 and 1965: No Counseling All Patients: Use specific methods of  contraceoptive and identify adverse effects (R), Stop tobacco use, implementing the 5A counseling approach (R), LARCS discussed, Encourage mammagram for women 59 or older and younger than 50 if conditions support (R), Emergency Contraception Offered (R) if unprotected sex in past 5 days and/or propyhlactically as indicated., Provide  emergency contraception counseling (R), Typical use rates for method effectiveness (R), Delay future pregnancy from 18 months to 5 years (R) at [facility] visit, Appropriate referral for additional services as needed (R) Contraception Wrap Up Current Method: (P) Hormonal Implant  STI screening Patient reports 1 of partners in last year.  Does this patient desire STI screening?  Yes  Hepatitis C screening Has patient been screened once for HCV in the past?  Yes  No results found for: HCVAB  Does the patient meet criteria for HCV testing? No  (If yes-- Screen for HCV through [facility]) Criteria:  Since the last HCV result, does the patient have any of the following? - Current drug use - Have a partner with drug use - Has been incarcerated  Hepatitis B screening Does the patient meet criteria for HBV testing? No Criteria:  -Household, sexual or needle sharing contact with HBV -History of drug use -HIV positive -Those with known Hep C  Cervical Cancer Screening  Result Date Procedure Results Follow-ups  03/29/2018 HM PAP SMEAR HM Pap smear: Negative, HPV negative     Health Maintenance Due  Topic Date Due   Hepatitis B Vaccines (1 of 3 - 19+ 3-dose series) Never done   Cervical Cancer Screening (HPV/Pap Cotest)  03/29/2021   HPV VACCINES (2 - 3-dose SCDM series) 07/15/2021   COVID-19 Vaccine (1 - 2024-25 season) Never done    The following portions of the patient's history were reviewed and updated as appropriate: allergies, current medications, past family history, past medical history, past social history, past surgical history and problem list. Problem list updated.  Objective:   Vitals:   05/13/24 1020  BP: 115/75  Pulse: 66  Weight: 165 lb 6.4 oz (75 kg)  Height: 5' 5 (1.651 m)    Physical Exam Constitutional:      Appearance: Normal appearance.  HENT:     Head: Normocephalic.     Mouth/Throat:     Mouth: Mucous membranes are moist.     Pharynx: No  oropharyngeal exudate or posterior oropharyngeal erythema.  Eyes:     General: No scleral icterus.       Right eye: No discharge.        Left eye: No discharge.  Pulmonary:     Effort: Pulmonary effort is normal.  Genitourinary:    General: Normal vulva.     Labia:        Right: No rash, tenderness, lesion or injury.        Left: No rash, tenderness, lesion or injury.      Vagina: Normal. No vaginal discharge, tenderness or lesions.     Cervix: No discharge, friability, lesion, erythema or cervical bleeding.  Lymphadenopathy:     Cervical: No cervical adenopathy.     Right cervical: No superficial or posterior cervical adenopathy.    Left cervical: No superficial or posterior cervical adenopathy.  Skin:    General: Skin is warm and dry.  Neurological:     Mental Status: She is alert.  Psychiatric:        Mood and Affect: Mood normal.        Behavior: Behavior normal.    Assessment and Plan:  Jill [name] is a 38 y.o. female [phone] presenting to the [facility] Department for an yearly wellness and contraception visit  1. Screening for cervical cancer  - IGP, Aptima HPV  2. Screening for venereal disease  - Gonococcus culture - WET PREP FOR TRICH, YEAST, CLUE - Syphilis Serology, [place] [facility] - HIV [place] [facility] - Chlamydia/Gonorrhea [place] [facility]  3. Trichomonas infection  - metroNIDAZOLE  (FLAGYL ) 500 MG tablet; Take 1 tablet (500 mg total) by mouth 2 (two) times daily for 7 days.  4. Family planning (Primary)  Contraception counseling:  Reviewed options based on patient desire and reproductive life plan. Patient has Nexplanon  in place and will return for replacement next July, 2026.  The patient will follow up in  1 year for surveillance.  The patient was told to call with any further questions, or with any concerns about this method of contraception.  Emphasized use of condoms 100% of the time for STI prevention.  Emergency Contraception  Precautions (ECP): Patient assessed for need of ECP. She is not a candidate based on LARC in place and unexpired .  Educated on ECP and reviewed options.   Return in about 1 year (around 05/13/2025).  Future Appointments  Date Time Provider Department Center  05/13/2024  4:00 PM Melvin Pao, NP [facility]  06/09/2024 11:00 AM [facility] ENDO[facility]NEURO [facility] None  06/13/2024 11:00 AM Dartha Ernst, MD [facility] None    Damien FORBES [place] [facility]

## 2024-05-13 NOTE — Progress Notes (Addendum)
 Pt is here for acute visit and STD screening. Wet prep reviewed with patient and patient was dispensed Metronidazole  500 mg 2x/day for 7 days today. I provided counseling today regarding the medication, the side effects and when to call clinic. Patient given the opportunity to ask questions for any clarifications. Condoms declined, brochure and contact card given. Wilkie Drought, RN.

## 2024-05-14 ENCOUNTER — Ambulatory Visit: Payer: Self-pay | Admitting: Nurse Practitioner

## 2024-05-14 LAB — COMPREHENSIVE METABOLIC PANEL WITH GFR
ALT: 5 IU/L (ref 0–32)
AST: 18 IU/L (ref 0–40)
Albumin: 4.5 g/dL (ref 3.9–4.9)
Alkaline Phosphatase: 68 IU/L (ref 44–121)
BUN/Creatinine Ratio: 19 (ref 9–23)
BUN: 16 mg/dL (ref 6–20)
Bilirubin Total: 0.5 mg/dL (ref 0.0–1.2)
CO2: 22 mmol/L (ref 20–29)
Calcium: 9.7 mg/dL (ref 8.7–10.2)
Chloride: 104 mmol/L (ref 96–106)
Creatinine, Ser: 0.86 mg/dL (ref 0.57–1.00)
Globulin, Total: 2.4 g/dL (ref 1.5–4.5)
Glucose: 82 mg/dL (ref 70–99)
Potassium: 4.1 mmol/L (ref 3.5–5.2)
Sodium: 140 mmol/L (ref 134–144)
Total Protein: 6.9 g/dL (ref 6.0–8.5)
eGFR: 89 mL/min/1.73 (ref >59)

## 2024-05-14 MED ORDER — METHOCARBAMOL 500 MG PO TABS: 500.0000 mg | ORAL_TABLET | Freq: Three times a day (TID) | ORAL | 0 refills | Status: AC | PRN

## 2024-05-14 NOTE — Assessment & Plan Note (Signed)
 Chronic.  Controlled.  Continue with current medication regimen of Valsartan  and Amlodipine .  Refills sent today.  Labs ordered today.  Return to clinic in 6 months for reevaluation.  Call sooner if concerns arise.

## 2024-05-16 LAB — IGP, APTIMA HPV
HPV Aptima: NEGATIVE
PAP Smear Comment: 0

## 2024-05-18 ENCOUNTER — Ambulatory Visit: Payer: Self-pay | Admitting: Family Medicine

## 2024-05-18 LAB — GONOCOCCUS CULTURE

## 2024-05-18 NOTE — Progress Notes (Signed)
 PAP smear reviewed, result: NILM (normal), HPV negative. Based on this result and patient's prior cervical cancer screenings, [facility] currently recommends repeat PAP smear in 5 years with HPV co-testing.  Jill Helling, MD 05/18/24  7:34 PM

## 2024-05-20 ENCOUNTER — Other Ambulatory Visit: Payer: Self-pay | Admitting: Nurse Practitioner

## 2024-05-21 ENCOUNTER — Encounter: Payer: Self-pay | Admitting: Family Medicine

## 2024-05-21 NOTE — Telephone Encounter (Signed)
 Requested medication (s) are due for refill today - no  Requested medication (s) are on the active medication list -yes  Future visit scheduled -no  Last refill: 03/31/24 #90  Notes to clinic: non delegated Rx  Requested Prescriptions  Pending Prescriptions Disp Refills   methimazole  (TAPAZOLE ) 5 MG tablet [Pharmacy Med Name: METHIMAZOLE  5MG  TABLETS] 90 tablet 0    Sig: TAKE 1 TABLET(5 MG) BY MOUTH DAILY     Not Delegated - Endocrinology:  Hyperthyroid Agents Failed - 05/21/2024  2:56 PM      Failed - This refill cannot be delegated      Passed - TSH in normal range and within 180 days    TSH  Date Value Ref Range Status  02/21/2024 0.43 mIU/L Final    Comment:              Reference Range .           > or = 20 Years  0.40-4.50 .                Pregnancy Ranges           First trimester    0.26-2.66           Second trimester   0.55-2.73           Third trimester    0.43-2.91          Passed - T3 Total in normal range and within 180 days    T3, Free  Date Value Ref Range Status  02/21/2024 2.9 2.3 - 4.2 pg/mL Final         Passed - T4 free in normal range and within 180 days    Free T4  Date Value Ref Range Status  02/21/2024 1.3 0.8 - 1.8 ng/dL Final         Passed - Valid encounter within last 6 months    Recent Outpatient Visits           1 week ago Primary hypertension   [place] Melvin Pao, NP                 Requested Prescriptions  Pending Prescriptions Disp Refills   methimazole  (TAPAZOLE ) 5 MG tablet [Pharmacy Med Name: METHIMAZOLE  5MG  TABLETS] 90 tablet 0    Sig: TAKE 1 TABLET(5 MG) BY MOUTH DAILY     Not Delegated - Endocrinology:  Hyperthyroid Agents Failed - 05/21/2024  2:56 PM      Failed - This refill cannot be delegated      Passed - TSH in normal range and within 180 days    TSH  Date Value Ref Range Status  02/21/2024 0.43 mIU/L Final    Comment:              Reference Range .           >  or = 20 Years  0.40-4.50 .                Pregnancy Ranges           First trimester    0.26-2.66           Second trimester   0.55-2.73           Third trimester    0.43-2.91          Passed - T3 Total in normal range and within 180 days    T3, Free  Date Value Ref Range  Status  02/21/2024 2.9 2.3 - 4.2 pg/mL Final         Passed - T4 free in normal range and within 180 days    Free T4  Date Value Ref Range Status  02/21/2024 1.3 0.8 - 1.8 ng/dL Final         Passed - Valid encounter within last 6 months    Recent Outpatient Visits           1 week ago Primary hypertension   [facility] Melvin Pao, NP

## 2024-06-09 ENCOUNTER — Other Ambulatory Visit

## 2024-06-09 DIAGNOSIS — E0500 Thyrotoxicosis with diffuse goiter without thyrotoxic crisis or storm: Secondary | ICD-10-CM | POA: Diagnosis not present

## 2024-06-09 LAB — TSH: TSH: 0.58 m[IU]/L

## 2024-06-09 LAB — T4, FREE: Free T4: 1.2 ng/dL (ref 0.8–1.8)

## 2024-06-09 LAB — T3, FREE: T3, Free: 2.7 pg/mL (ref 2.3–4.2)

## 2024-06-13 ENCOUNTER — Ambulatory Visit (INDEPENDENT_AMBULATORY_CARE_PROVIDER_SITE_OTHER): Admitting: "Endocrinology

## 2024-06-13 ENCOUNTER — Encounter: Payer: Self-pay | Admitting: "Endocrinology

## 2024-06-13 VITALS — BP 120/80 | Ht 65.0 in | Wt 165.0 lb

## 2024-06-13 DIAGNOSIS — E0590 Thyrotoxicosis, unspecified without thyrotoxic crisis or storm: Secondary | ICD-10-CM

## 2024-06-13 NOTE — Progress Notes (Signed)
 Outpatient Endocrinology Note Jill Birmingham, MD  06/13/24   Jill [name] 1985-12-24 [id number]  Referring Provider: Melvin Pao, NP Primary Care Provider: Melvin Pao, NP Subjective  No chief complaint on file.   Assessment & Plan  Diagnoses and all orders for this visit:  Hyperthyroidism -     NM Thyroid  Scan/Uptake 24 Hr; Future -     TSH -     T3, free -     T4, free -     NM Thyroid  Scan/Uptake 24 Hr -     CBC with Differential/Platelet; Future   Jill [name] is currently taking methimazole  5 mg qd. Patient currently clinically and biochemically euthyroid.  Discussed the etiology for hyperthyroidism. Educated on thyroid  axis.  Recommend the following: Take methimazole  5 mg qd a day. Repeat labs in 3 months or sooner if symptoms of hyper or hypothyroidism develop.  02/21/24 Educated on definitive options of treatment including RAI therapy and surgery. Patient does not want RAI at this time.  Counseled on: -complications of untreated hyperthyroidism including atrial fibrillation, heart failure and osteoporosis -side effects of Methimazole  including but not limited to allergic reaction, rash, bone marrow suppression, liver dysfunction and teratogenic potential -implications in pregnancy and breastfeeding -compliance and follow up needs    Has thyroid  ultrasound in the past, no known thyroid  nodule   I have reviewed current medications, nurse's notes, allergies, vital signs, past medical and surgical history, family medical history, and social history for this encounter. Counseled patient on symptoms, examination findings, lab findings, imaging results, treatment decisions and monitoring and prognosis. The patient understood the recommendations and agrees with the treatment plan. All questions regarding treatment plan were fully answered.   Return in about 4 months (around 10/13/2024) for visit + labs before next visit.   Jill Birmingham, MD   06/13/24   I have reviewed current medications, nurse's notes, allergies, vital signs, past medical and surgical history, family medical history, and social history for this encounter. Counseled patient on symptoms, examination findings, lab findings, imaging results, treatment decisions and monitoring and prognosis. The patient understood the recommendations and agrees with the treatment plan. All questions regarding treatment plan were fully answered.   History of Present Illness Jill [name] is a 38 y.o. year old female who presents to our clinic with Grave's disease diagnosed in 2023.    Symptoms suggestive of HYPOTHYROIDISM:  fatigue No weight gain No cold intolerance  No constipation  No  Symptoms suggestive of HYPERTHYROIDISM:  weight loss  No heat intolerance No hyperdefecation  No palpitations  No  Compressive symptoms:  dysphagia  No dysphonia  No positional dyspnea (especially with simultaneous arms elevation)  No  Smokes  No On biotin  No Personal history of head/neck surgery/irradiation  No  Adverse Drug Effects from Methimazole  (MMI): rash No fever No throat pain No arthritis No mouth ulcers No jaundice No loss of appetite No lymphadenopathy No  Grave's Ophthalmopathy Clinical Activity Score: 0/9  Physical Exam  BP 120/80   Ht 5' 5 (1.651 m)   Wt 165 lb (74.8 kg)   BMI 27.46 kg/m  Constitutional: well developed, well nourished Head: normocephalic, atraumatic, no exophthalmos Eyes: sclera anicteric, no redness Neck: + thyromegaly, no thyroid  tenderness; no nodules palpated Lungs: normal respiratory effort Neurology: alert and oriented, no fine hand tremor Skin: dry, no appreciable rashes Musculoskeletal: no appreciable defects Psychiatric: normal mood and affect  Allergies No Known Allergies  Current Medications Patient's Medications  New Prescriptions  No medications on file  Previous Medications   AMLODIPINE  (NORVASC ) 10 MG  TABLET    Take 1 tablet (10 mg total) by mouth daily.   ETONOGESTREL  (NEXPLANON ) 68 MG IMPL IMPLANT    1 each by Subdermal route once.   METHIMAZOLE  (TAPAZOLE ) 5 MG TABLET    Take 1 tablet (5 mg total) by mouth daily.   METHOCARBAMOL  (ROBAXIN ) 500 MG TABLET    Take 1 tablet (500 mg total) by mouth every 8 (eight) hours as needed for muscle spasms.   VALSARTAN  (DIOVAN ) 320 MG TABLET    Take 1 tablet (320 mg total) by mouth daily.  Modified Medications   No medications on file  Discontinued Medications   No medications on file    Past Medical History Past Medical History:  Diagnosis Date   History of chlamydia 01/22/2021   History of gonorrhea 04/11/2020   Hypertension 03/22/2016    Past Surgical History Past Surgical History:  Procedure Laterality Date   DIAGNOSTIC LAPAROSCOPY WITH REMOVAL OF ECTOPIC PREGNANCY Left 03/21/2021   Procedure: DIAGNOSTIC LAPAROSCOPY WITH REMOVAL OF ECTOPIC PREGNANCY;  Surgeon: Connell Davies, MD;  Location: [facility] ORS;  Service: Gynecology;  Laterality: Left;   NO PAST SURGERIES      Family History family history includes Diabetes in her father and mother; Hypertension in her father and mother.  Social History Social History   Socioeconomic History   Marital status: Single    Spouse name: Not on file   Number of children: Not on file   Years of education: Not on file   Highest education level: Associate degree: occupational, Scientist, product/process development, or vocational program  Occupational History   Not on file  Tobacco Use   Smoking status: Former    Types: Cigarettes    Start date: 10/23/2010   Smokeless tobacco: Never  Vaping Use   Vaping status: Never Used  Substance and Sexual Activity   Alcohol use: Not Currently    Alcohol/week: 1.0 standard drink of alcohol    Types: 1 Standard drinks or equivalent per week   Drug use: Never   Sexual activity: Yes    Partners: Male    Birth control/protection: Implant  Other Topics Concern   Not on file  Social History  Narrative   Not on file   Social Drivers of Health   Financial Resource Strain: Low Risk  (05/13/2024)   Overall Financial Resource Strain (CARDIA)    Difficulty of Paying Living Expenses: Not very hard  Food Insecurity: No Food Insecurity (05/13/2024)   Hunger Vital Sign    Worried About Running Out of Food in the Last Year: Never true    Ran Out of Food in the Last Year: Never true  Transportation Needs: No Transportation Needs (05/13/2024)   PRAPARE - Administrator, Civil Service (Medical): No    Lack of Transportation (Non-Medical): No  Physical Activity: Sufficiently Active (05/13/2024)   Exercise Vital Sign    Days of Exercise per Week: 6 days    Minutes of Exercise per Session: 30 min  Stress: No Stress Concern Present (05/13/2024)   [employer] of Occupational Health - Occupational Stress Questionnaire    Feeling of Stress: Not at all  Social Connections: Moderately Isolated (05/13/2024)   Social Connection and Isolation Panel    Frequency of Communication with Friends and Family: More than three times a week    Frequency of Social Gatherings with Friends and Family: Once a week    Attends Religious  Services: More than 4 times per year    Active Member of Clubs or Organizations: No    Attends Banker Meetings: Not on file    Marital Status: Never married  Intimate Partner Violence: Not At Risk (05/13/2024)   Humiliation, Afraid, Rape, and Kick questionnaire    Fear of Current or Ex-Partner: No    Emotionally Abused: No    Physically Abused: No    Sexually Abused: No    Laboratory Investigations Lab Results  Component Value Date   TSH 0.58 06/09/2024   TSH 0.43 02/21/2024   TSH 0.462 10/22/2023   FREET4 1.2 06/09/2024   FREET4 1.3 02/21/2024   FREET4 1.44 09/21/2022     Lab Results  Component Value Date   TSI <89 02/21/2024     No components found for: TRAB   Lab Results  Component Value Date   CHOL 159 10/22/2023   Lab  Results  Component Value Date   HDL 79 10/22/2023   Lab Results  Component Value Date   LDLCALC 71 10/22/2023   Lab Results  Component Value Date   TRIG 39 10/22/2023   Lab Results  Component Value Date   CHOLHDL 2.0 10/22/2023   Lab Results  Component Value Date   CREATININE 0.86 05/13/2024   No results found for: GFR    Component Value Date/Time   NA 140 05/13/2024 1618   K 4.1 05/13/2024 1618   CL 104 05/13/2024 1618   CO2 22 05/13/2024 1618   GLUCOSE 82 05/13/2024 1618   GLUCOSE 86 03/21/2021 1946   BUN 16 05/13/2024 1618   CREATININE 0.86 05/13/2024 1618   CALCIUM 9.7 05/13/2024 1618   PROT 6.9 05/13/2024 1618   ALBUMIN 4.5 05/13/2024 1618   AST 18 05/13/2024 1618   ALT 5 05/13/2024 1618   ALKPHOS 68 05/13/2024 1618   BILITOT 0.5 05/13/2024 1618   GFRNONAA >60 03/21/2021 1946   GFRAA 135 03/22/2017 1937      Latest Ref Rng & Units 05/13/2024    4:18 PM 10/22/2023    2:06 PM 04/09/2023    2:08 PM  BMP  Glucose 70 - 99 mg/dL 82  53  77   BUN 6 - 20 mg/dL 16  11  11    Creatinine 0.57 - 1.00 mg/dL 9.13  9.13  9.21   BUN/Creat Ratio 9 - 23 19  13  14    Sodium 134 - 144 mmol/L 140  140  142   Potassium 3.5 - 5.2 mmol/L 4.1  4.0  4.0   Chloride 96 - 106 mmol/L 104  107  107   CO2 20 - 29 mmol/L 22  23  23    Calcium 8.7 - 10.2 mg/dL 9.7  9.2  9.4        Component Value Date/Time   WBC 8.7 10/22/2023 1406   WBC 8.0 03/21/2021 1946   RBC 3.98 10/22/2023 1406   RBC 3.95 03/21/2021 1946   HGB 12.6 10/22/2023 1406   HCT 38.8 10/22/2023 1406   PLT 275 10/22/2023 1406   MCV 98 (H) 10/22/2023 1406   MCH 31.7 10/22/2023 1406   MCH 31.1 03/21/2021 1946   MCHC 32.5 10/22/2023 1406   MCHC 34.0 03/21/2021 1946   RDW 11.7 10/22/2023 1406   LYMPHSABS 1.6 10/22/2023 1406   MONOABS 0.7 03/21/2021 1946   EOSABS 0.1 10/22/2023 1406   BASOSABS 0.0 10/22/2023 1406      Parts of this note may have been dictated  using voice recognition software. There may be  variances in spelling and vocabulary which are unintentional. Not all errors are proofread. Please notify the dino if any discrepancies are noted or if the meaning of any statement is not clear.

## 2024-06-25 ENCOUNTER — Telehealth: Payer: Self-pay

## 2024-06-25 NOTE — Telephone Encounter (Signed)
 N/a

## 2024-07-21 ENCOUNTER — Encounter
Admission: RE | Admit: 2024-07-21 | Discharge: 2024-07-21 | Disposition: A | Discharge status: Home / Self Care | Source: Ambulatory Visit | Attending: "Endocrinology | Admitting: "Endocrinology

## 2024-07-21 DIAGNOSIS — E0590 Thyrotoxicosis, unspecified without thyrotoxic crisis or storm: Secondary | ICD-10-CM | POA: Insufficient documentation

## 2024-07-21 MED ORDER — SODIUM IODIDE I-123 7.4 MBQ CAPS
459.8000 | ORAL_CAPSULE | Freq: Once | ORAL | Status: AC
  Administered 2024-07-21: 459.8 via ORAL

## 2024-07-22 ENCOUNTER — Encounter
Admission: RE | Admit: 2024-07-22 | Discharge: 2024-07-22 | Disposition: A | Discharge status: Home / Self Care | Source: Ambulatory Visit | Attending: "Endocrinology | Admitting: "Endocrinology

## 2024-07-22 DIAGNOSIS — E0500 Thyrotoxicosis with diffuse goiter without thyrotoxic crisis or storm: Secondary | ICD-10-CM | POA: Diagnosis not present

## 2024-08-28 ENCOUNTER — Other Ambulatory Visit: Payer: Self-pay | Admitting: Nurse Practitioner

## 2024-08-29 NOTE — Telephone Encounter (Signed)
 Requested medication (s) are due for refill today: yes  Requested medication (s) are on the active medication list: yes  Last refill:  03/31/24  Future visit scheduled: yes  Notes to clinic:  Unable to refill per protocol, cannot delegate.      Requested Prescriptions  Pending Prescriptions Disp Refills   methimazole  (TAPAZOLE ) 5 MG tablet [Pharmacy Med Name: METHIMAZOLE  5MG  TABLETS] 90 tablet 0    Sig: TAKE 1 TABLET(5 MG) BY MOUTH DAILY     Not Delegated - Endocrinology:  Hyperthyroid Agents Failed - 08/29/2024  2:01 PM      Failed - This refill cannot be delegated      Passed - TSH in normal range and within 180 days    TSH  Date Value Ref Range Status  06/09/2024 0.58 mIU/L Final    Comment:              Reference Range .           > or = 20 Years  0.40-4.50 .                Pregnancy Ranges           First trimester    0.26-2.66           Second trimester   0.55-2.73           Third trimester    0.43-2.91          Passed - T3 Total in normal range and within 180 days    T3, Free  Date Value Ref Range Status  06/09/2024 2.7 2.3 - 4.2 pg/mL Final         Passed - T4 free in normal range and within 180 days    Free T4  Date Value Ref Range Status  06/09/2024 1.2 0.8 - 1.8 ng/dL Final         Passed - Valid encounter within last 6 months    Recent Outpatient Visits           3 months ago Primary hypertension   [facility] Melvin Pao, NP

## 2024-09-11 ENCOUNTER — Other Ambulatory Visit: Payer: Self-pay

## 2024-09-11 DIAGNOSIS — E0590 Thyrotoxicosis, unspecified without thyrotoxic crisis or storm: Secondary | ICD-10-CM

## 2024-09-11 DIAGNOSIS — E0500 Thyrotoxicosis with diffuse goiter without thyrotoxic crisis or storm: Secondary | ICD-10-CM

## 2024-09-22 ENCOUNTER — Other Ambulatory Visit

## 2024-09-22 DIAGNOSIS — E0500 Thyrotoxicosis with diffuse goiter without thyrotoxic crisis or storm: Secondary | ICD-10-CM | POA: Diagnosis not present

## 2024-09-22 DIAGNOSIS — E0590 Thyrotoxicosis, unspecified without thyrotoxic crisis or storm: Secondary | ICD-10-CM | POA: Diagnosis not present

## 2024-09-22 LAB — CBC WITH DIFFERENTIAL/PLATELET
Absolute Lymphocytes: 2006 {cells}/uL (ref 850–3900)
Absolute Monocytes: 348 {cells}/uL (ref 200–950)
Basophils Absolute: 47 {cells}/uL (ref 0–200)
Basophils Relative: 0.8 %
Eosinophils Absolute: 47 {cells}/uL (ref 15–500)
Eosinophils Relative: 0.8 %
HCT: 41.2 % (ref 35.9–46.0)
Hemoglobin: 13.4 g/dL (ref 11.7–15.5)
MCH: 31.5 pg (ref 27.0–33.0)
MCHC: 32.5 g/dL (ref 31.6–35.4)
MCV: 96.9 fL (ref 81.4–101.7)
MPV: 9.3 fL (ref 7.5–12.5)
Monocytes Relative: 5.9 %
Neutro Abs: 3452 {cells}/uL (ref 1500–7800)
Neutrophils Relative %: 58.5 %
Platelets: 285 Thousand/uL (ref 140–400)
RBC: 4.25 Million/uL (ref 3.80–5.10)
RDW: 11.9 % (ref 11.0–15.0)
Total Lymphocyte: 34 %
WBC: 5.9 Thousand/uL (ref 3.8–10.8)

## 2024-09-23 LAB — TSH: TSH: 0.43 m[IU]/L

## 2024-09-23 LAB — T4, FREE: Free T4: 1.2 ng/dL (ref 0.8–1.8)

## 2024-09-23 LAB — T3, FREE: T3, Free: 2.8 pg/mL (ref 2.3–4.2)

## 2024-09-26 ENCOUNTER — Telehealth: Admitting: "Endocrinology

## 2024-09-26 ENCOUNTER — Encounter: Payer: Self-pay | Admitting: "Endocrinology

## 2024-09-26 VITALS — Ht 65.0 in | Wt 171.0 lb

## 2024-09-26 DIAGNOSIS — E0590 Thyrotoxicosis, unspecified without thyrotoxic crisis or storm: Secondary | ICD-10-CM | POA: Diagnosis not present

## 2024-09-26 MED ORDER — METHIMAZOLE 5 MG PO TABS: 5.0000 mg | ORAL_TABLET | Freq: Every day | ORAL | 1 refills | Status: DC

## 2024-09-26 NOTE — Progress Notes (Signed)
 The patient reports they are currently: Jill [name]. I spent 6 minutes on the video with the patient on the date of service. I spent an additional 5 minutes on pre- and post-visit activities on the date of service.   The patient was physically located in [place]  or a state in which I am permitted to provide care. The patient and/or parent/guardian understood that s/he may incur co-pays and cost sharing, and agreed to the telemedicine visit. The visit was reasonable and appropriate under the circumstances given the patient's presentation at the time.  The patient and/or parent/guardian has been advised of the potential risks and limitations of this mode of treatment (including, but not limited to, the absence of in-person examination) and has agreed to be treated using telemedicine. The patient's/patient's family's questions regarding telemedicine have been answered.   The patient and/or parent/guardian has also been advised to contact their provider's office for worsening conditions, and seek emergency medical treatment and/or call 911 if the patient deems either necessary.     Outpatient Endocrinology Note [name], MD  09/26/24   Jill [name] 1986/04/22 [id number]  Referring Provider: Melvin Pao, NP Primary Care Provider: Melvin Pao, NP Subjective  No chief complaint on file.   Assessment & Plan  Diagnoses and all orders for this visit:  Hyperthyroidism -     TSH -     T3, free -     T4, free  Other orders -     methimazole  (TAPAZOLE ) 5 MG tablet; Take 1 tablet (5 mg total) by mouth daily.    Jill [name] is currently taking methimazole  5 mg qd. TSI/TRAb -ve but diagnosis of graves disease based on + RAI scan 07/21/24: THYROID  SCAN AND UPTAKE images and report reviewed; Uniform uptake within enlarged thyroid  gland. The pyramidal lobe is subtly evident. 4 hour I-123 uptake = 53.5% (normal 5-20%) 24 hour I-123 uptake = 75% (normal  10-30%) IMPRESSION: Imaging findings and iodine  uptake consistent with Graves disease. Patient currently clinically and biochemically euthyroid.  Discussed the etiology for hyperthyroidism. Educated on thyroid  axis.  Recommend the following: Take methimazole  5 mg qd a day. Repeat labs in 3 months or sooner if symptoms of hyper or hypothyroidism develop.  02/21/24 Educated on definitive options of treatment including RAI therapy and surgery. Patient does not want RAI at this time.  Counseled on: -complications of untreated hyperthyroidism including atrial fibrillation, heart failure and osteoporosis -side effects of Methimazole  including but not limited to allergic reaction, rash, bone marrow suppression, liver dysfunction and teratogenic potential -implications in pregnancy and breastfeeding -compliance and follow up needs    Has thyroid  ultrasound in the past, no known thyroid  nodule   I have reviewed current medications, nurse's notes, allergies, vital signs, past medical and surgical history, family medical history, and social history for this encounter. Counseled patient on symptoms, examination findings, lab findings, imaging results, treatment decisions and monitoring and prognosis. The patient understood the recommendations and agrees with the treatment plan. All questions regarding treatment plan were fully answered.   Return in about 6 months (around 03/27/2025) for visit + labs before next visit.   [name], MD  09/26/24   I have reviewed current medications, nurse's notes, allergies, vital signs, past medical and surgical history, family medical history, and social history for this encounter. Counseled patient on symptoms, examination findings, lab findings, imaging results, treatment decisions and monitoring and prognosis. The patient understood the recommendations and agrees with the treatment plan. All questions regarding treatment plan were  fully answered.   History of  Present Illness Jill [name] is a 38 y.o. year old female who presents to our clinic with Grave's disease diagnosed in 2023.    Symptoms suggestive of HYPOTHYROIDISM:  fatigue No weight gain No cold intolerance  No constipation  No  Symptoms suggestive of HYPERTHYROIDISM:  weight loss  No heat intolerance No hyperdefecation  No palpitations  No  Compressive symptoms:  dysphagia  No dysphonia  No positional dyspnea (especially with simultaneous arms elevation)  No  Smokes  No On biotin  No Personal history of head/neck surgery/irradiation  No  Adverse Drug Effects from Methimazole  (MMI): rash No fever No throat pain No arthritis No mouth ulcers No jaundice No loss of appetite No lymphadenopathy No  Grave's Ophthalmopathy Clinical Activity Score: 0/9  Physical Exam  Ht 5' 5 (1.651 m)   Wt 171 lb (77.6 kg)   BMI 28.46 kg/m  Constitutional: well developed, well nourished Head: normocephalic, atraumatic, no exophthalmos Eyes: sclera anicteric, no redness Neck: + thyromegaly, no thyroid  tenderness; no nodules palpated Lungs: normal respiratory effort Neurology: alert and oriented, no fine hand tremor Skin: dry, no appreciable rashes Musculoskeletal: no appreciable defects Psychiatric: normal mood and affect  Allergies No Known Allergies  Current Medications Patient's Medications  New Prescriptions   No medications on file  Previous Medications   AMLODIPINE  (NORVASC ) 10 MG TABLET    Take 1 tablet (10 mg total) by mouth daily.   ETONOGESTREL  (NEXPLANON ) 68 MG IMPL IMPLANT    1 each by Subdermal route once.   METHOCARBAMOL  (ROBAXIN ) 500 MG TABLET    Take 1 tablet (500 mg total) by mouth every 8 (eight) hours as needed for muscle spasms.   VALSARTAN  (DIOVAN ) 320 MG TABLET    Take 1 tablet (320 mg total) by mouth daily.  Modified Medications   Modified Medication Previous Medication   METHIMAZOLE  (TAPAZOLE ) 5 MG TABLET methimazole  (TAPAZOLE ) 5 MG tablet       Take 1 tablet (5 mg total) by mouth daily.    TAKE 1 TABLET(5 MG) BY MOUTH DAILY  Discontinued Medications   No medications on file    Past Medical History Past Medical History:  Diagnosis Date   History of chlamydia 01/22/2021   History of gonorrhea 04/11/2020   Hypertension 03/22/2016    Past Surgical History Past Surgical History:  Procedure Laterality Date   DIAGNOSTIC LAPAROSCOPY WITH REMOVAL OF ECTOPIC PREGNANCY Left 03/21/2021   Procedure: DIAGNOSTIC LAPAROSCOPY WITH REMOVAL OF ECTOPIC PREGNANCY;  Surgeon: Connell Davies, MD;  Location: [facility] ORS;  Service: Gynecology;  Laterality: Left;   NO PAST SURGERIES      Family History family history includes Diabetes in her father and mother; Hypertension in her father and mother.  Social History Social History   Socioeconomic History   Marital status: Single    Spouse name: Not on file   Number of children: Not on file   Years of education: Not on file   Highest education level: Associate degree: occupational, scientist, product/process development, or vocational program  Occupational History   Not on file  Tobacco Use   Smoking status: Former    Types: Cigarettes    Start date: 10/23/2010   Smokeless tobacco: Never  Vaping Use   Vaping status: Never Used  Substance and Sexual Activity   Alcohol use: Not Currently    Alcohol/week: 1.0 standard drink of alcohol    Types: 1 Standard drinks or equivalent per week   Drug use: Never  Sexual activity: Yes    Partners: Male    Birth control/protection: Implant  Other Topics Concern   Not on file  Social History Narrative   Not on file   Social Drivers of Health   Financial Resource Strain: Low Risk  (05/13/2024)   Overall Financial Resource Strain (CARDIA)    Difficulty of Paying Living Expenses: Not very hard  Food Insecurity: No Food Insecurity (05/13/2024)   Hunger Vital Sign    Worried About Running Out of Food in the Last Year: Never true    Ran Out of Food in the Last Year: Never true   Transportation Needs: No Transportation Needs (05/13/2024)   PRAPARE - Administrator, Civil Service (Medical): No    Lack of Transportation (Non-Medical): No  Physical Activity: Sufficiently Active (05/13/2024)   Exercise Vital Sign    Days of Exercise per Week: 6 days    Minutes of Exercise per Session: 30 min  Stress: No Stress Concern Present (05/13/2024)   [employer] of Occupational Health - Occupational Stress Questionnaire    Feeling of Stress: Not at all  Social Connections: Moderately Isolated (05/13/2024)   Social Connection and Isolation Panel    Frequency of Communication with Friends and Family: More than three times a week    Frequency of Social Gatherings with Friends and Family: Once a week    Attends Religious Services: More than 4 times per year    Active Member of Clubs or Organizations: No    Attends Banker Meetings: Not on file    Marital Status: Never married  Intimate Partner Violence: Not At Risk (05/13/2024)   Humiliation, Afraid, Rape, and Kick questionnaire    Fear of Current or Ex-Partner: No    Emotionally Abused: No    Physically Abused: No    Sexually Abused: No    Laboratory Investigations Lab Results  Component Value Date   TSH 0.43 09/22/2024   TSH 0.58 06/09/2024   TSH 0.43 02/21/2024   FREET4 1.2 09/22/2024   FREET4 1.2 06/09/2024   FREET4 1.3 02/21/2024     Lab Results  Component Value Date   TSI <89 02/21/2024     No components found for: TRAB   Lab Results  Component Value Date   CHOL 159 10/22/2023   Lab Results  Component Value Date   HDL 79 10/22/2023   Lab Results  Component Value Date   LDLCALC 71 10/22/2023   Lab Results  Component Value Date   TRIG 39 10/22/2023   Lab Results  Component Value Date   CHOLHDL 2.0 10/22/2023   Lab Results  Component Value Date   CREATININE 0.86 05/13/2024   No results found for: GFR    Component Value Date/Time   NA 140 05/13/2024 1618   K  4.1 05/13/2024 1618   CL 104 05/13/2024 1618   CO2 22 05/13/2024 1618   GLUCOSE 82 05/13/2024 1618   GLUCOSE 86 03/21/2021 1946   BUN 16 05/13/2024 1618   CREATININE 0.86 05/13/2024 1618   CALCIUM 9.7 05/13/2024 1618   PROT 6.9 05/13/2024 1618   ALBUMIN 4.5 05/13/2024 1618   AST 18 05/13/2024 1618   ALT 5 05/13/2024 1618   ALKPHOS 68 05/13/2024 1618   BILITOT 0.5 05/13/2024 1618   GFRNONAA >60 03/21/2021 1946   GFRAA 135 03/22/2017 1937      Latest Ref Rng & Units 05/13/2024    4:18 PM 10/22/2023    2:06 PM 04/09/2023  2:08 PM  BMP  Glucose 70 - 99 mg/dL 82  53  77   BUN 6 - 20 mg/dL 16  11  11    Creatinine 0.57 - 1.00 mg/dL 9.13  9.13  9.21   BUN/Creat Ratio 9 - 23 19  13  14    Sodium 134 - 144 mmol/L 140  140  142   Potassium 3.5 - 5.2 mmol/L 4.1  4.0  4.0   Chloride 96 - 106 mmol/L 104  107  107   CO2 20 - 29 mmol/L 22  23  23    Calcium 8.7 - 10.2 mg/dL 9.7  9.2  9.4        Component Value Date/Time   WBC 5.9 09/22/2024 1115   RBC 4.25 09/22/2024 1115   HGB 13.4 09/22/2024 1115   HGB 12.6 10/22/2023 1406   HCT 41.2 09/22/2024 1115   HCT 38.8 10/22/2023 1406   PLT 285 09/22/2024 1115   PLT 275 10/22/2023 1406   MCV 96.9 09/22/2024 1115   MCV 98 (H) 10/22/2023 1406   MCH 31.5 09/22/2024 1115   MCHC 32.5 09/22/2024 1115   RDW 11.9 09/22/2024 1115   RDW 11.7 10/22/2023 1406   LYMPHSABS 1.6 10/22/2023 1406   MONOABS 0.7 03/21/2021 1946   EOSABS 47 09/22/2024 1115   EOSABS 0.1 10/22/2023 1406   BASOSABS 47 09/22/2024 1115   BASOSABS 0.0 10/22/2023 1406      Parts of this note may have been dictated using voice recognition software. There may be variances in spelling and vocabulary which are unintentional. Not all errors are proofread. Please notify the dino if any discrepancies are noted or if the meaning of any statement is not clear.

## 2024-09-26 NOTE — Patient Instructions (Signed)
 What Is Graves' Disease? Graves' disease is a lifelong (chronic) autoimmune disease that causes your thyroid  to make too much thyroid  hormone. It happens because your body makes antibodies to your thyroid  gland.  It's one of the most common causes of hyperthyroidism (overactive thyroid ), especially if you have a family history of thyroid  problems. Graves' disease mainly affects your thyroid . But it can also affect your eyes and skin.  Graves' disease speeds up your metabolism. This can affect several aspects of your health. You may not feel like yourself or even feel out of control of your body. It's important to get medical treatment if you develop signs of this condition.  Symptoms and Causes Symptoms of Graves' disease include shortness of breath, rapid heart rate, tremor, heat intolerance and weight loss The onset of Graves' disease symptoms can be gradual or sudden. Symptoms of Graves' disease Symptoms of Graves' disease (and hyperthyroidism) include:  Heat intolerance and excessive sweating Rapid heartbeat (tachycardia) Shortness of breath (dyspnea) Tremor (shakiness) Anxiety or nervousness Diarrhea and/or pooping more frequently Enlarged thyroid  gland (goiter) Hair loss Insomnia Light menstrual bleeding or fewer or absent periods Thin, warm and moist skin Weight loss The onset of Graves' disease symptoms is usually gradual. It often takes several weeks or months to develop. But sometimes, it develops suddenly over a few days. You may experience some of these symptoms or many at the same time.  See your healthcare provider if you have symptoms. Graves' disease needs to be treated.  Other symptoms related to Graves' disease Graves' disease can also cause eye issues, like:  Bulging eyes Double vision Gritty, irritated eyes Light sensitivity (photophobia) Pressure or pain in your eyes This is called Graves' orbitopathy or thyroid  eye disease. About 1 in 3 people with Graves'  disease develop it.  Up to 4% of people with Graves' disease develop pretibial myxedema (Graves' dermopathy). It causes a lumpy, discolored thickening of your skin -- usually on your legs.  About 1% of people with Graves' disease have thyroid  acropachy. This causes clubbing of your fingers and toes.  Graves' disease cause Graves' disease happens when something triggers your immune system to overproduce an antibody called thyroid -stimulating immunoglobulin (TSI). TSI attaches to healthy thyroid  cells, causing your thyroid  to overproduce thyroid  hormones.  In one study, researchers estimated that your genes contribute to 79% of your risk of developing Graves' disease. This means that Graves' disease is partially hereditary. The remaining percentage of risk (21%) is due to environmental factors.  Scientists don't know exactly why your immune system attacks your thyroid . But they think the trigger may happen due to environmental factors, like:  Stress Pregnancy and the postpartum period Viruses and infections Risk factors Risk factors for Graves' disease include:  A biological family history of Graves' disease Having another autoimmune condition, like Type 1 diabetes or pernicious anemia Having estrogen as your main sex hormone Selenium and vitamin D deficiencies Smoking Complications of Graves' disease Untreated or undermanaged Graves' disease increases your risk of the following complications:  Heart problems. Graves' disease can cause atrial fibrillation (Afib). This increases your risk of heart failure and stroke. Osteoporosis. This condition makes your bones thinner and less dense. It can lead to repeated bone fractures. Thyroid  storm. This happens when your thyroid  releases a bunch of thyroid  hormone in a short amount of time. It's a rare complication that's life-threatening. Graves' disease may also increase your risk of thyroid  cancer.  Untreated Graves' disease during pregnancy  can be harmful to you  and the fetus. It can increase the risk of:  Congestive heart failure in the mother Miscarriage Preeclampsia Premature labor Low birth weight Infant hyperthyroidism Diagnosis and Tests How doctors diagnose Graves' disease Your healthcare provider will ask about your symptoms and medical history. This includes your family history of thyroid  disease. They'll also do a physical exam.  They may also recommend the following tests to confirm a Graves' disease diagnosis:  Thyroid  blood tests. These tests check the level of thyroid  hormone and thyroid -stimulating hormone (TSH) in your blood. Thyroid  antibody blood tests. These tests check for the antibodies linked with Graves' disease. Thyroid  uptake and scan. In this test, you swallow a small amount of radioactive iodine . If your thyroid  absorbs a lot of the iodine , it can be a sign of Graves' disease. Doppler ultrasound. This test checks for increased blood flow in your thyroid  due to Graves' disease. You may need this test if radioactive iodine  uptake isn't safe due to pregnancy or breastfeeding.  Management and Treatment  How is Graves' disease treated? Graves' disease is a lifelong (chronic) condition. But treatments can keep your thyroid  hormone levels in check. Medical care may even make the disease temporarily go away (remission).  Treatments for Graves' disease include:  Beta-blockers: Beta-blockers, like propranolol and atenolol, are often the first line of treatment for Graves' disease. These medications regulate your heart rate until other hyperthyroidism treatments take effect.  Antithyroid medications: Antithyroid medications, like methimazole  and propylthiouracil, block thyroid  hormone production.  Radioiodine therapy: This therapy slowly destroys thyroid  gland cells. As your thyroid  gland shrinks, hormone levels return to normal. But you'll likely eventually develop hypothyroidism.  Surgery: A  thyroidectomy involves surgically removing all or part of your thyroid  gland. Radioiodine therapy and thyroidectomy usually lead to hypothyroidism (underactive thyroid ). If you develop this condition, you'll need to take thyroid  replacement hormone medications for the rest of your life. But hypothyroidism is easier to treat than hyperthyroidism. It causes fewer long-term health problems.  All the Graves' disease treatment options have benefits and risks. And there's no agreement in the medical community on which treatment is the best. It's important to discuss all the options in detail with your provider to make the best choice for you.  Researchers are currently studying antigen-specific immunotherapy as a treatment for Graves' disease. Ask your healthcare provider if a clinical trial is an option.  When should I see my healthcare provider? You'll need to see your healthcare provider regularly throughout your life. They'll make sure your thyroid  levels are in check and your treatment plan is working. If you develop any new symptoms, talk to your provider.  If you're experiencing symptoms of thyroid  storm, call 911 (or your local emergency service number) or get to the nearest emergency room (ER) as soon as possible. Thyroid  storm is life-threatening.   Outlook / Prognosis What can I expect if I have this condition? If Graves' disease is properly treated, the prognosis (outlook) is generally good. Without treatment, Graves' disease can cause complications that can affect your overall health or life expectancy.

## 2024-11-06 ENCOUNTER — Other Ambulatory Visit: Payer: Self-pay | Admitting: Nurse Practitioner

## 2024-11-07 ENCOUNTER — Other Ambulatory Visit: Payer: Self-pay

## 2024-11-07 DIAGNOSIS — E0590 Thyrotoxicosis, unspecified without thyrotoxic crisis or storm: Secondary | ICD-10-CM

## 2024-11-07 MED ORDER — METHIMAZOLE 5 MG PO TABS: 5.0000 mg | ORAL_TABLET | Freq: Every day | ORAL | 1 refills | Status: AC

## 2024-11-17 ENCOUNTER — Encounter: Admitting: Nurse Practitioner

## 2024-11-26 ENCOUNTER — Ambulatory Visit (INDEPENDENT_AMBULATORY_CARE_PROVIDER_SITE_OTHER): Admitting: Nurse Practitioner

## 2024-11-26 ENCOUNTER — Encounter: Payer: Self-pay | Admitting: Nurse Practitioner

## 2024-11-26 VITALS — BP 125/85 | HR 66 | Temp 97.6°F | Ht 65.95 in | Wt 177.4 lb

## 2024-11-26 DIAGNOSIS — Z23 Encounter for immunization: Secondary | ICD-10-CM | POA: Diagnosis not present

## 2024-11-26 DIAGNOSIS — I10 Essential (primary) hypertension: Secondary | ICD-10-CM

## 2024-11-26 DIAGNOSIS — Z0000 Encounter for general adult medical examination without abnormal findings: Secondary | ICD-10-CM

## 2024-11-26 DIAGNOSIS — Z136 Encounter for screening for cardiovascular disorders: Secondary | ICD-10-CM | POA: Diagnosis not present

## 2024-11-26 MED ORDER — VALSARTAN 320 MG PO TABS: 320.0000 mg | ORAL_TABLET | Freq: Every day | ORAL | 1 refills | Status: AC

## 2024-11-26 MED ORDER — AMLODIPINE BESYLATE 10 MG PO TABS: 10.0000 mg | ORAL_TABLET | Freq: Every day | ORAL | 1 refills | Status: AC

## 2024-11-26 NOTE — Assessment & Plan Note (Signed)
Chronic.  Controlled.  Continue with current medication regimen of Amlodipine and Valsartan.  Refills sent today.  Labs ordered today.  Return to clinic in 6 months for reevaluation.  Call sooner if concerns arise.    

## 2024-11-26 NOTE — Progress Notes (Signed)
 "  BP 125/85 (BP Location: Left Arm, Patient Position: Sitting, Cuff Size: Normal)   Pulse 66   Temp 97.6 F (36.4 C) (Oral)   Ht 5' 5.95 (1.675 m)   Wt 177 lb 6.4 oz (80.5 kg)   LMP 11/10/2024 (Approximate)   SpO2 97%   BMI 28.68 kg/m    Subjective:    Patient ID: Jill [name], female    DOB: 27-Aug-1986, 39 y.o.   MRN: [id number]  HPI: Jill [name] is a 39 y.o. female presenting on 11/26/2024 for comprehensive medical examination. Current medical complaints include:none  She currently lives with: Menopausal Symptoms: no  HYPERTENSION without Chronic Kidney Disease Hypertension status: controlled  Satisfied with current treatment? yes Duration of hypertension: years BP monitoring frequency:  not checking BP range:  BP medication side effects:  no Medication compliance: excellent compliance Previous BP meds:amlodipine  and valsartan  Aspirin: no Recurrent headaches: no Visual changes: no Palpitations: no Dyspnea: no Chest pain: no Lower extremity edema: no Dizzy/lightheaded: no  Depression Screen done today and results listed below:     11/26/2024    9:53 AM 05/13/2024    4:07 PM 05/13/2024   10:33 AM 10/22/2023    1:58 PM 04/23/2023    2:11 PM  Depression screen PHQ 2/9  Decreased Interest 0 0 0 0 0  Down, Depressed, Hopeless 0 0 0 0 0  PHQ - 2 Score 0 0 0 0 0  Altered sleeping 0 0  0 0  Tired, decreased energy 0 0  0 0  Change in appetite 0 0  0 0  Feeling bad or failure about yourself  0 0  0 0  Trouble concentrating 0 0  0 0  Moving slowly or fidgety/restless 0 0  0 0  Suicidal thoughts 0 0  0 0  PHQ-9 Score 0 0   0  0   Difficult doing work/chores Not difficult at all Not difficult at all   Not difficult at all     Data saved with a previous flowsheet row definition    The patient does not have a history of falls. I did complete a risk assessment for falls. A plan of care for falls was documented.   Past Medical History:  Past Medical History:   Diagnosis Date   History of chlamydia 01/22/2021   History of gonorrhea 04/11/2020   Hypertension 03/22/2016    Surgical History:  Past Surgical History:  Procedure Laterality Date   DIAGNOSTIC LAPAROSCOPY WITH REMOVAL OF ECTOPIC PREGNANCY Left 03/21/2021   Procedure: DIAGNOSTIC LAPAROSCOPY WITH REMOVAL OF ECTOPIC PREGNANCY;  Surgeon: Connell Davies, MD;  Location: [facility] ORS;  Service: Gynecology;  Laterality: Left;   NO PAST SURGERIES      Medications:  Current Outpatient Medications on File Prior to Visit  Medication Sig   etonogestrel  (NEXPLANON ) 68 MG IMPL implant 1 each by Subdermal route once.   methimazole  (TAPAZOLE ) 5 MG tablet Take 1 tablet (5 mg total) by mouth daily.   methocarbamol  (ROBAXIN ) 500 MG tablet Take 1 tablet (500 mg total) by mouth every 8 (eight) hours as needed for muscle spasms.   No current facility-administered medications on file prior to visit.    Allergies:  Allergies[1]  Social History:  Social History   Socioeconomic History   Marital status: Single    Spouse name: Not on file   Number of children: Not on file   Years of education: Not on file   Highest education level: Associate degree: occupational, scientist, product/process development, or  vocational program  Occupational History   Not on file  Tobacco Use   Smoking status: Former    Types: Cigarettes    Start date: 10/23/2010   Smokeless tobacco: Never  Vaping Use   Vaping status: Never Used  Substance and Sexual Activity   Alcohol use: Not Currently    Alcohol/week: 1.0 standard drink of alcohol    Types: 1 Standard drinks or equivalent per week   Drug use: Never   Sexual activity: Yes    Partners: Male    Birth control/protection: Implant  Other Topics Concern   Not on file  Social History Narrative   Not on file   Social Drivers of Health   Tobacco Use: Medium Risk (11/26/2024)   Patient History    Smoking Tobacco Use: Former    Smokeless Tobacco Use: Never    Passive Exposure: Not on Programmer, Applications Strain: Low Risk (05/13/2024)   Overall Financial Resource Strain (CARDIA)    Difficulty of Paying Living Expenses: Not very hard  Food Insecurity: No Food Insecurity (05/13/2024)   Epic    Worried About Radiation Protection Practitioner of Food in the Last Year: Never true    Ran Out of Food in the Last Year: Never true  Transportation Needs: No Transportation Needs (05/13/2024)   Epic    Lack of Transportation (Medical): No    Lack of Transportation (Non-Medical): No  Physical Activity: Sufficiently Active (05/13/2024)   Exercise Vital Sign    Days of Exercise per Week: 6 days    Minutes of Exercise per Session: 30 min  Stress: No Stress Concern Present (05/13/2024)   [employer] of Occupational Health - Occupational Stress Questionnaire    Feeling of Stress: Not at all  Social Connections: Moderately Isolated (05/13/2024)   Social Connection and Isolation Panel    Frequency of Communication with Friends and Family: More than three times a week    Frequency of Social Gatherings with Friends and Family: Once a week    Attends Religious Services: More than 4 times per year    Active Member of [employer] or Organizations: No    Attends Engineer, Structural: Not on file    Marital Status: Never married  Intimate Partner Violence: Not At Risk (05/13/2024)   Epic    Fear of Current or Ex-Partner: No    Emotionally Abused: No    Physically Abused: No    Sexually Abused: No  Depression (PHQ2-9): Low Risk (11/26/2024)   Depression (PHQ2-9)    PHQ-2 Score: 0  Alcohol Screen: Low Risk (05/13/2024)   Alcohol Screen    Last Alcohol Screening Score (AUDIT): 1  Housing: Unknown (05/13/2024)   Epic    Unable to Pay for Housing in the Last Year: No    Number of Times Moved in the Last Year: Not on file    Homeless in the Last Year: No  Utilities: Not on file  Health Literacy: Not on file   Tobacco Use History[2] Social History   Substance and Sexual Activity  Alcohol Use Not  Currently   Alcohol/week: 1.0 standard drink of alcohol   Types: 1 Standard drinks or equivalent per week    Family History:  Family History  Problem Relation Age of Onset   Hypertension Mother    Diabetes Mother    Hypertension Father    Diabetes Father     Past medical history, surgical history, medications, allergies, family history and social history reviewed with patient today  and changes made to appropriate areas of the chart.   Review of Systems  Eyes:  Negative for blurred vision and double vision.  Respiratory:  Negative for shortness of breath.   Cardiovascular:  Negative for chest pain, palpitations and leg swelling.  Neurological:  Negative for dizziness and headaches.   All other ROS negative except what is listed above and in the HPI.      Objective:    BP 125/85 (BP Location: Left Arm, Patient Position: Sitting, Cuff Size: Normal)   Pulse 66   Temp 97.6 F (36.4 C) (Oral)   Ht 5' 5.95 (1.675 m)   Wt 177 lb 6.4 oz (80.5 kg)   LMP 11/10/2024 (Approximate)   SpO2 97%   BMI 28.68 kg/m   Wt Readings from Last 3 Encounters:  11/26/24 177 lb 6.4 oz (80.5 kg)  09/26/24 171 lb (77.6 kg)  06/13/24 165 lb (74.8 kg)    Physical Exam Vitals and nursing note reviewed.  Constitutional:      General: She is awake. She is not in acute distress.    Appearance: She is well-developed. She is not ill-appearing.  HENT:     Head: Normocephalic and atraumatic.     Right Ear: Hearing, tympanic membrane, ear canal and external ear normal. No drainage.     Left Ear: Hearing, tympanic membrane, ear canal and external ear normal. No drainage.     Nose: Nose normal.     Right Sinus: No maxillary sinus tenderness or frontal sinus tenderness.     Left Sinus: No maxillary sinus tenderness or frontal sinus tenderness.     Mouth/Throat:     Mouth: Mucous membranes are moist.     Pharynx: Oropharynx is clear. Uvula midline. No pharyngeal swelling, oropharyngeal exudate or  posterior oropharyngeal erythema.  Eyes:     General: Lids are normal.        Right eye: No discharge.        Left eye: No discharge.     Extraocular Movements: Extraocular movements intact.     Conjunctiva/sclera: Conjunctivae normal.     Pupils: Pupils are equal, round, and reactive to light.     Visual Fields: Right eye visual fields normal and left eye visual fields normal.  Neck:     Thyroid : No thyromegaly.     Vascular: No carotid bruit.     Trachea: Trachea normal.  Cardiovascular:     Rate and Rhythm: Normal rate and regular rhythm.     Heart sounds: Normal heart sounds. No murmur heard.    No gallop.  Pulmonary:     Effort: Pulmonary effort is normal. No accessory muscle usage or respiratory distress.     Breath sounds: Normal breath sounds.  Chest:  Breasts:    Right: Normal.     Left: Normal.  Abdominal:     General: Bowel sounds are normal.     Palpations: Abdomen is soft. There is no hepatomegaly or splenomegaly.     Tenderness: There is no abdominal tenderness.  Musculoskeletal:        General: Normal range of motion.     Cervical back: Normal range of motion and neck supple.     Right lower leg: No edema.     Left lower leg: No edema.  Lymphadenopathy:     Head:     Right side of head: No submental, submandibular, tonsillar, preauricular or posterior auricular adenopathy.     Left side of head: No submental, submandibular, tonsillar,  preauricular or posterior auricular adenopathy.     Cervical: No cervical adenopathy.     Upper Body:     Right upper body: No supraclavicular, axillary or pectoral adenopathy.     Left upper body: No supraclavicular, axillary or pectoral adenopathy.  Skin:    General: Skin is warm and dry.     Capillary Refill: Capillary refill takes less than 2 seconds.     Findings: No rash.  Neurological:     Mental Status: She is alert and oriented to person, place, and time.     Gait: Gait is intact.  Psychiatric:        Attention  and Perception: Attention normal.        Mood and Affect: Mood normal.        Speech: Speech normal.        Behavior: Behavior normal. Behavior is cooperative.        Thought Content: Thought content normal.        Judgment: Judgment normal.     Results for orders placed or performed in visit on 09/11/24  CBC with Differential/Platelet   Collection Time: 09/22/24 11:15 AM  Result Value Ref Range   WBC 5.9 3.8 - 10.8 Thousand/uL   RBC 4.25 3.80 - 5.10 Million/uL   Hemoglobin 13.4 11.7 - 15.5 g/dL   HCT 58.7 64.0 - 53.9 %   MCV 96.9 81.4 - 101.7 fL   MCH 31.5 27.0 - 33.0 pg   MCHC 32.5 31.6 - 35.4 g/dL   RDW 88.0 88.9 - 84.9 %   Platelets 285 140 - 400 Thousand/uL   MPV 9.3 7.5 - 12.5 fL   Neutro Abs 3,452 1,500 - 7,800 cells/uL   Absolute Lymphocytes 2,006 850 - 3,900 cells/uL   Absolute Monocytes 348 200 - 950 cells/uL   Eosinophils Absolute 47 15 - 500 cells/uL   Basophils Absolute 47 0 - 200 cells/uL   Neutrophils Relative % 58.5 %   Total Lymphocyte 34.0 %   Monocytes Relative 5.9 %   Eosinophils Relative 0.8 %   Basophils Relative 0.8 %      Assessment & Plan:   Problem List Items Addressed This Visit       Cardiovascular and Mediastinum   Hypertension 137/91 on 04/27/21   Chronic.  Controlled.  Continue with current medication regimen of Amlodipine  and Valsartan .  Refills sent today. Labs ordered today.  Return to clinic in 6 months for reevaluation.  Call sooner if concerns arise.        Relevant Medications   amLODipine  (NORVASC ) 10 MG tablet   valsartan  (DIOVAN ) 320 MG tablet   Other Visit Diagnoses       Annual physical exam    -  Primary   Health maintenance reviewed during visit today.  Labs ordered.  Vaccines reviewed.  PAP up to date.   Relevant Orders   CBC with Differential/Platelet   Comprehensive metabolic panel with GFR   Lipid panel   TSH     Screening for ischemic heart disease       Relevant Orders   Lipid panel     Immunization due        Relevant Orders   HPV 9-valent vaccine,Recombinat        Follow up plan: Return in about 6 months (around 05/26/2025) for HTN, HLD, DM2 FU.   LABORATORY TESTING:  - Pap smear: up to date  IMMUNIZATIONS:   - Tdap: Tetanus vaccination status reviewed: last tetanus booster  within 10 years. - Influenza: Refused - Pneumovax: Not applicable - Prevnar: Not applicable - COVID: Refused - HPV: Administered today - Shingrix vaccine: Not applicable  SCREENING: -Mammogram: Not applicable  - Colonoscopy: Not applicable  - Bone Density: Not applicable  -Hearing Test: Not applicable  -Spirometry: Not applicable   PATIENT COUNSELING:   Advised to take 1 mg of folate supplement per day if capable of pregnancy.   Sexuality: Discussed sexually transmitted diseases, partner selection, use of condoms, avoidance of unintended pregnancy  and contraceptive alternatives.   Advised to avoid cigarette smoking.  I discussed with the patient that most people either abstain from alcohol or drink within safe limits (<=14/week and <=4 drinks/occasion for males, <=7/weeks and <= 3 drinks/occasion for females) and that the risk for alcohol disorders and other health effects rises proportionally with the number of drinks per week and how often a drinker exceeds daily limits.  Discussed cessation/primary prevention of drug use and availability of treatment for abuse.   Diet: Encouraged to adjust caloric intake to maintain  or achieve ideal body weight, to reduce intake of dietary saturated fat and total fat, to limit sodium intake by avoiding high sodium foods and not adding table salt, and to maintain adequate dietary potassium and calcium preferably from fresh fruits, vegetables, and low-fat dairy products.    stressed the importance of regular exercise  Injury prevention: Discussed safety belts, safety helmets, smoke detector, smoking near bedding or upholstery.   Dental health: Discussed importance of  regular tooth brushing, flossing, and dental visits.    NEXT PREVENTATIVE PHYSICAL DUE IN 1 YEAR. Return in about 6 months (around 05/26/2025) for HTN, HLD, DM2 FU.             [1] No Known Allergies [2]  Social History Tobacco Use  Smoking Status Former   Types: Cigarettes   Start date: 10/23/2010  Smokeless Tobacco Never   "

## 2024-11-27 ENCOUNTER — Ambulatory Visit: Payer: Self-pay | Admitting: Nurse Practitioner

## 2024-11-27 LAB — CBC WITH DIFFERENTIAL/PLATELET
Basophils Absolute: 0 10*3/uL (ref 0.0–0.2)
Basos: 1 %
EOS (ABSOLUTE): 0 10*3/uL (ref 0.0–0.4)
Eos: 1 %
Hematocrit: 44.1 % (ref 34.0–46.6)
Hemoglobin: 14.2 g/dL (ref 11.1–15.9)
Immature Grans (Abs): 0.1 10*3/uL (ref 0.0–0.1)
Immature Granulocytes: 1 %
Lymphocytes Absolute: 2 10*3/uL (ref 0.7–3.1)
Lymphs: 29 %
MCH: 31.5 pg (ref 26.6–33.0)
MCHC: 32.2 g/dL (ref 31.5–35.7)
MCV: 98 fL — ABNORMAL HIGH (ref 79–97)
Monocytes Absolute: 0.5 10*3/uL (ref 0.1–0.9)
Monocytes: 7 %
Neutrophils Absolute: 4.1 10*3/uL (ref 1.4–7.0)
Neutrophils: 61 %
Platelets: 266 10*3/uL (ref 150–450)
RBC: 4.51 x10E6/uL (ref 3.77–5.28)
RDW: 11.8 % (ref 11.7–15.4)
WBC: 6.7 10*3/uL (ref 3.4–10.8)

## 2024-11-27 LAB — LIPID PANEL
Chol/HDL Ratio: 2.1 ratio (ref 0.0–4.4)
Cholesterol, Total: 156 mg/dL (ref 100–199)
HDL: 75 mg/dL
LDL Chol Calc (NIH): 72 mg/dL (ref 0–99)
Triglycerides: 37 mg/dL (ref 0–149)
VLDL Cholesterol Cal: 9 mg/dL (ref 5–40)

## 2024-11-27 LAB — COMPREHENSIVE METABOLIC PANEL WITH GFR
ALT: 5 [IU]/L (ref 0–32)
AST: 16 [IU]/L (ref 0–40)
Albumin: 3.8 g/dL — ABNORMAL LOW (ref 3.9–4.9)
Alkaline Phosphatase: 59 [IU]/L (ref 41–116)
BUN/Creatinine Ratio: 18 (ref 9–23)
BUN: 12 mg/dL (ref 6–20)
Bilirubin Total: 0.4 mg/dL (ref 0.0–1.2)
CO2: 22 mmol/L (ref 20–29)
Calcium: 8.6 mg/dL — ABNORMAL LOW (ref 8.7–10.2)
Chloride: 106 mmol/L (ref 96–106)
Creatinine, Ser: 0.67 mg/dL (ref 0.57–1.00)
Globulin, Total: 1.5 g/dL (ref 1.5–4.5)
Glucose: 79 mg/dL (ref 70–99)
Potassium: 4.2 mmol/L (ref 3.5–5.2)
Sodium: 141 mmol/L (ref 134–144)
Total Protein: 5.3 g/dL — ABNORMAL LOW (ref 6.0–8.5)
eGFR: 114 mL/min/{1.73_m2}

## 2024-11-27 LAB — TSH: TSH: 0.855 u[IU]/mL (ref 0.450–4.500)

## 2025-03-23 ENCOUNTER — Other Ambulatory Visit

## 2025-03-30 ENCOUNTER — Ambulatory Visit: Admitting: "Endocrinology

## 2025-05-27 ENCOUNTER — Ambulatory Visit: Admitting: Nurse Practitioner
